# Patient Record
Sex: Female | Born: 1975 | Race: White | Hispanic: No | Marital: Married | Smoking: Current every day smoker
Health system: Southern US, Community
[De-identification: ages and names within clinical notes are randomized; demographics above are authoritative.]

## PROBLEM LIST (undated history)

## (undated) DIAGNOSIS — J45909 Unspecified asthma, uncomplicated: Secondary | ICD-10-CM

## (undated) HISTORY — PX: TUBAL LIGATION: SHX77

---

## 2016-04-14 ENCOUNTER — Encounter: Payer: Self-pay | Admitting: Medical Oncology

## 2016-04-14 ENCOUNTER — Emergency Department
Admission: EM | Admit: 2016-04-14 | Discharge: 2016-04-14 | Disposition: A | Discharge status: Home / Self Care | Payer: Self-pay | Attending: Emergency Medicine | Admitting: Emergency Medicine

## 2016-04-14 DIAGNOSIS — J45909 Unspecified asthma, uncomplicated: Secondary | ICD-10-CM | POA: Insufficient documentation

## 2016-04-14 DIAGNOSIS — K029 Dental caries, unspecified: Secondary | ICD-10-CM | POA: Insufficient documentation

## 2016-04-14 DIAGNOSIS — K0889 Other specified disorders of teeth and supporting structures: Secondary | ICD-10-CM

## 2016-04-14 HISTORY — DX: Unspecified asthma, uncomplicated: J45.909

## 2016-04-14 MED ORDER — AMOXICILLIN 500 MG PO TABS: 500.0000 mg | ORAL_TABLET | Freq: Two times a day (BID) | ORAL | 0 refills | Status: DC

## 2016-04-14 MED ORDER — OXYCODONE-ACETAMINOPHEN 5-325 MG PO TABS: 1.0000 | ORAL_TABLET | ORAL | 0 refills | Status: DC | PRN

## 2016-04-14 NOTE — ED Provider Notes (Signed)
[facility] Emergency Department Provider Note   ____________________________________________   First MD Initiated Contact with Patient 04/14/16 1833     (approximate)  I have reviewed the triage vital signs and the nursing notes.   HISTORY  Chief Complaint Dental Pain    HPI Brenda Waller is a 40 y.o. female presents for evaluation of left upper tooth pain. Patient states symptoms have started school secondary to broken tooth. Recently moved here from out of town desires referral to local tenderness. Scars pain is 10 over 10 nonradiating.   Past Medical History:  Diagnosis Date  . Asthma     There are no active problems to display for this patient.   No past surgical history on file.  Prior to Admission medications   Medication Sig Start Date End Date Taking? Authorizing Provider  amoxicillin (AMOXIL) 500 MG tablet Take 1 tablet (500 mg total) by mouth 2 (two) times daily. 04/14/16   Evangeline Dakinharles M Maricela Schreur, PA-C  oxyCODONE-acetaminophen (ROXICET) 5-325 MG tablet Take 1-2 tablets by mouth every 4 (four) hours as needed for severe pain. 04/14/16   Evangeline Dakinharles M Tonnia Bardin, PA-C    Allergies Ibuprofen and Sulfa antibiotics  No family history on file.  Social History Social History  Substance Use Topics  . Smoking status: Not on file  . Smokeless tobacco: Not on file  . Alcohol use Not on file    Review of Systems Constitutional: No fever/chills ENT: No sore throat.Positive for dental pain. Cardiovascular: Denies chest pain. Respiratory: Denies shortness of breath. Skin: Negative for rash. Neurological: Negative for headaches, focal weakness or numbness.  10-point ROS otherwise negative.  ____________________________________________   PHYSICAL EXAM:  VITAL SIGNS: ED Triage Vitals  Enc Vitals Group     BP 04/14/16 1808 133/70     Pulse Rate 04/14/16 1808 84     Resp 04/14/16 1808 18     Temp 04/14/16 1808 97.7 F (36.5 C)     Temp Source  04/14/16 1808 Oral     SpO2 04/14/16 1808 99 %     Weight 04/14/16 1809 176 lb (79.8 kg)     Height 04/14/16 1809 5\' 3"  (1.6 m)     Head Circumference --      Peak Flow --      Pain Score 04/14/16 1809 8     Pain Loc --      Pain Edu? --      Excl. in GC? --     Constitutional: Alert and oriented. Well appearing and in no acute distress. Nose: No congestion/rhinnorhea. Mouth/Throat: Mucous membranes are moist.  Oropharynx non-erythematous. His dental caries with fractured left upper incisor. Gums slightly erythematous and tender to palpation. Neck: No stridor.  Able, for range of motion nontender. Musculoskeletal: No lower extremity tenderness nor edema.  No joint effusions. Neurologic:  Normal speech and language. No gross focal neurologic deficits are appreciated. No gait instability. Skin:  Skin is warm, dry and intact. No rash noted. Psychiatric: Mood and affect are normal. Speech and behavior are normal.  ____________________________________________   LABS (all labs ordered are listed, but only abnormal results are displayed)  Labs Reviewed - No data to display ____________________________________________    PROCEDURES  Procedure(s) performed: None  Procedures  Critical Care performed: No  ____________________________________________   INITIAL IMPRESSION / ASSESSMENT AND PLAN / ED COURSE  Pertinent labs & imaging results that were available during my care of the patient were reviewed by me and considered in my  medical decision making (see chart for details).  Dental pain with fractured tooth. Rx given for amoxicillin 500 mg 3 times a day Percocet 5/325. A list of local dental providers was given.  Clinical Course     ____________________________________________   FINAL CLINICAL IMPRESSION(S) / ED DIAGNOSES  Final diagnoses:  Pain, dental  Dental caries      NEW MEDICATIONS STARTED DURING THIS VISIT:  New Prescriptions   AMOXICILLIN (AMOXIL) 500  MG TABLET    Take 1 tablet (500 mg total) by mouth 2 (two) times daily.   OXYCODONE-ACETAMINOPHEN (ROXICET) 5-325 MG TABLET    Take 1-2 tablets by mouth every 4 (four) hours as needed for severe pain.     Note:  This document was prepared using Dragon voice recognition software and may include unintentional dictation errors.   Evangeline Dakin, PA-C 04/14/16 1847    Rockne Menghini, MD 04/14/16 2038

## 2016-04-14 NOTE — ED Notes (Signed)
Pt reports top left jaw/tooth pain - pt reports swelling but denies drainage

## 2016-04-14 NOTE — Discharge Instructions (Signed)
OPTIONS FOR DENTAL FOLLOW UP CARE ° °[place] Department of Health and Human Services - Local Safety Net Dental Clinics °http://www.ncdhhs.gov/dph/oralhealth/services/safetynetclinics.htm °  °[facility] (336-562-3123) ° °[facility] ([phone]) ° °[facility] ([phone] ext [phone]) ° °[facility] ([phone]) ° °[facility] ([phone]) °This clinic caters to the indigent population and is on a lottery system. °Location: °[facility], [address], [place] °Clinic Hours: °Wednesdays from 6pm - 9pm, patients seen by a lottery system. °For dates, call or go to [email or web address] °Services: °Cleanings, fillings and simple extractions. °Payment Options: °DENTAL WORK IS FREE OF CHARGE. Bring proof of income or support. °Best way to get seen: °Arrive at 5:15 pm - this is a lottery, NOT first come/first serve, so arriving earlier will not increase your chances of being seen. °  °  °[facility] °[phone] °Select option 1 for emergencies °  °Location: °[facility], [address], [place] °Clinic Hours: °No walk-ins accepted - call the day before to schedule an appointment. °Check in times are 9:30 am and 1:30 pm. °Services: °Simple extractions, temporary fillings, pulpectomy/pulp debridement, uncomplicated abscess drainage. °Payment Options: °PAYMENT IS DUE AT THE TIME OF SERVICE.  Fee is usually $100-200, additional surgical procedures (e.g. abscess drainage) may be extra. °Cash, checks, Visa/MasterCard accepted.  Can file Medicaid if patient is covered for dental - patient should call case worker to check. °No discount for [facility] patients. °Best way to get seen: °MUST call the day before and get onto the schedule. Can usually be seen the next 1-2 days. No walk-ins accepted. °  °  °[facility] °[phone] °   °Location: °[facility], [address], [place] °Clinic Hours: °M, W, Th, F 8am or 1:30pm, Tues 9a or 1:30 - first come/first served. °Services: °Simple extractions, temporary fillings, uncomplicated abscess drainage.  You do not need to be an [place] resident. °Payment Options: °PAYMENT IS DUE AT THE TIME OF SERVICE. °Dental insurance, otherwise sliding scale - bring proof of income or support. °Depending on income and treatment needed, cost is usually $50-200. °Best way to get seen: °Arrive early as it is first come/first served. °  °  °[facility] °[phone] °  °Location: °[address] °Clinic Hours: °Mon-Thu 8a-5p °Services: °Most basic dental services including extractions and fillings. °Payment Options: °PAYMENT IS DUE AT THE TIME OF SERVICE. °Sliding scale, up to 50% off - bring proof if income or support. °Medicaid with dental option accepted. °Best way to get seen: °Call to schedule an appointment, can usually be seen within 2 weeks OR they will try to see walk-ins - show up at 8a or 2p (you may have to wait). °  °  °[facility] °[phone] °[place] RESIDENTS ONLY °  °Location: °[facility], [address], [place], [place] [address] °Clinic Hours: By appointment only. °Monday - Thursday 8am-5pm, Friday 8am-12pm °Services: Cleanings, fillings, extractions. °Payment Options: °PAYMENT IS DUE AT THE TIME OF SERVICE. °Cash, Visa or MasterCard. Sliding scale - $30 minimum per service. °Best way to get seen: °Come in to office, complete packet and make an appointment - need proof of income °or support monies for each household member and proof of [place] residence. °Usually takes about a month to get in. °  °  °[facility] °[phone] °  °Location: °[address]   [place] °Clinic Hours: Walk-in Urgent Care Dental Services are offered Monday-Friday  mornings only. °The numbers of emergencies accepted daily is limited to the number of °providers available. °Maximum 15 - Mondays, Wednesdays & Thursdays °Maximum 10 - Tuesdays & Fridays °Services: °You do not need to be a  [place] resident to be seen for a dental emergency. °Emergencies are defined as pain, swelling, abnormal bleeding, or dental trauma. Walkins will receive x-rays if needed. °NOTE: Dental cleaning is not an emergency. °Payment Options: °PAYMENT IS DUE AT THE TIME OF SERVICE. °Minimum co-pay is $40.00 for uninsured patients. °Minimum co-pay is $3.00 for Medicaid with dental coverage. °Dental Insurance is accepted and must be presented at time of visit. °Medicare does not cover dental. °Forms of payment: Cash, credit card, checks. °Best way to get seen: °If not previously registered with the clinic, walk-in dental registration begins at 7:15 am and is on a first come/first serve basis. °If previously registered with the clinic, call to make an appointment. °  °  °[facility] °[phone] °[place] RESIDENTS ONLY °  °Location: °[address], [place], [place] °Clinic Hours: °Mon-Thu 10a-2p °Services: Extractions only! °Payment Options: °FREE (donations accepted) - bring proof of income or support °Best way to get seen: °Call and schedule an appointment OR come at 8am on the 1st Monday of every month (except for holidays) when it is first come/first served. °  °  °[facility] °[phone] °  °Location: °[address], [place] °Clinic Hours: °Friday mornings °Services, Payment Options, Best way to get seen: °Call for infoOPTIONS FOR DENTAL FOLLOW UP CARE ° °[place] Department of Health and Human Services - Local Safety Net Dental Clinics °http://www.ncdhhs.gov/dph/oralhealth/services/safetynetclinics.htm °  °[facility] ([phone]) ° °[facility] ([phone]) ° °[facility] ([phone] ext 237) ° °[facility]  ([phone]) ° °[facility] ([phone]) °This clinic caters to the indigent population and is on a lottery system. °Location: °[facility], [address], [place] °Clinic Hours: °Wednesdays from 6pm - 9pm, patients seen by a lottery system. °For dates, call or go to [email or web address] °Services: °Cleanings, fillings and simple extractions. °Payment Options: °DENTAL WORK IS FREE OF CHARGE. Bring proof of income or support. °Best way to get seen: °Arrive at 5:15 pm - this is a lottery, NOT first come/first serve, so arriving earlier will not increase your chances of being seen. °  °  °[facility] °[phone] °Select option 1 for emergencies °  °Location: °[facility], [address], [place] °Clinic Hours: °No walk-ins accepted - call the day before to schedule an appointment. °Check in times are 9:30 am and 1:30 pm. °Services: °Simple extractions, temporary fillings, pulpectomy/pulp debridement, uncomplicated abscess drainage. °Payment Options: °PAYMENT IS DUE AT THE TIME OF SERVICE.  Fee is usually $100-200, additional surgical procedures (e.g. abscess drainage) may be extra. °Cash, checks, Visa/MasterCard accepted.  Can file Medicaid if patient is covered for dental - patient should call case worker to check. °No discount for [facility] patients. °Best way to get seen: °MUST call the day before and get onto the schedule. Can usually be seen the next 1-2 days. No walk-ins accepted. °  °  °[facility] °[phone] °  °Location: °[facility], [address], [place] °Clinic Hours: °M, W, Th, F 8am or 1:30pm, Tues 9a or 1:30 - first come/first served. °Services: °Simple extractions, temporary fillings, uncomplicated   abscess drainage.  You do not need to be an [place] resident. °Payment Options: °PAYMENT IS DUE AT THE TIME OF SERVICE. °Dental insurance,  otherwise sliding scale - bring proof of income or support. °Depending on income and treatment needed, cost is usually $50-200. °Best way to get seen: °Arrive early as it is first come/first served. °  °  °[facility] °[phone] °  °Location: °[address] °Clinic Hours: °Mon-Thu 8a-5p °Services: °Most basic dental services including extractions and fillings. °Payment Options: °PAYMENT IS DUE AT THE TIME OF SERVICE. °Sliding scale, up to 50% off - bring proof if income or support. °Medicaid with dental option accepted. °Best way to get seen: °Call to schedule an appointment, can usually be seen within 2 weeks OR they will try to see walk-ins - show up at 8a or 2p (you may have to wait). °  °  °[facility] °[phone] °[place] RESID[place] ONLY °  °Location: °[facility], [address], [place], [place] [address] °Clinic Hours: By appointment only. °Monday - Thursday 8am-5pm, Friday 8am-12pm °Services: Cleanings, fillings, extractions. °Payment Options: °PAYMENT IS DUE AT THE TIME OF SERVICE. °Cash, Visa or MasterCard. Sliding scale - $30 minimum per service. °Best way to get seen: °Come in to office, complete packet and make an appointment - need proof of income °or support monies for each household member and proof of [place] residence. °Usually takes about a month to get in. °  °  °[facility] °[phone] °  °Location: °[address] [place] °Clinic Hours: Walk-in Urgent Care Dental Services are offered Monday-Friday mornings only. °The numbers of emergencies accepted daily is limited to the number of °providers available. °Maximum 15 - Mondays, Wednesdays & Thursdays °Maximum 10 - Tuesdays & Fridays °Services: °You do not need to be a [place] resident to be seen for a dental emergency. °Emergencies are defined as pain, swelling, abnormal bleeding, or dental trauma. Walkins  will receive x-rays if needed. °NOTE: Dental cleaning is not an emergency. °Payment Options: °PAYMENT IS DUE AT THE TIME OF SERVICE. °Minimum co-pay is $40.00 for uninsured patients. °Minimum co-pay is $3.00 for Medicaid with dental coverage. °Dental Insurance is accepted and must be presented at time of visit. °Medicare does not cover dental. °Forms of payment: Cash, credit card, checks. °Best way to get seen: °If not previously registered with the clinic, walk-in dental registration begins at 7:15 am and is on a first come/first serve basis. °If previously registered with the clinic, call to make an appointment. °  °  °[facility] °[phone] °[place] RESIDENTS ONLY °  °Location: °[address], [place],  °Clinic Hours: °Mon-Thu 10a-2p °Services: Extractions only! °Payment Options: °FREE (donations accepted) - bring proof of income or support °Best way to get seen: °Call and schedule an appointment OR come at 8am on the 1st Monday of every month (except for holidays) when it is first come/first served. °  °  °[facility] °[phone] °  °Location: °[address], [place] °Clinic Hours: °Friday mornings °Services, Payment Options, Best way to get seen: °Call for info ° ° °

## 2016-04-14 NOTE — ED Triage Notes (Signed)
Pt reports broken tooth to left front tooth. No injury.

## 2016-07-14 ENCOUNTER — Encounter: Payer: Self-pay | Admitting: Emergency Medicine

## 2016-07-14 ENCOUNTER — Emergency Department: Payer: Medicaid Other

## 2016-07-14 ENCOUNTER — Emergency Department
Admission: EM | Admit: 2016-07-14 | Discharge: 2016-07-14 | Disposition: A | Discharge status: Home / Self Care | Payer: Medicaid Other | Attending: Student in an Organized Health Care Education/Training Program | Admitting: Student in an Organized Health Care Education/Training Program

## 2016-07-14 DIAGNOSIS — F17210 Nicotine dependence, cigarettes, uncomplicated: Secondary | ICD-10-CM | POA: Insufficient documentation

## 2016-07-14 DIAGNOSIS — J069 Acute upper respiratory infection, unspecified: Secondary | ICD-10-CM | POA: Insufficient documentation

## 2016-07-14 DIAGNOSIS — J45909 Unspecified asthma, uncomplicated: Secondary | ICD-10-CM | POA: Insufficient documentation

## 2016-07-14 MED ORDER — BENZONATATE 100 MG PO CAPS: 100.0000 mg | ORAL_CAPSULE | Freq: Three times a day (TID) | ORAL | 0 refills | Status: AC | PRN

## 2016-07-14 MED ORDER — AMOXICILLIN-POT CLAVULANATE 875-125 MG PO TABS: 1.0000 | ORAL_TABLET | Freq: Two times a day (BID) | ORAL | 0 refills | Status: AC

## 2016-07-14 NOTE — ED Triage Notes (Signed)
Patient presents to the ED with cough, congestion, and nasal drainage.  Patient noted slight amount of blood with blowing her nose.  Patient reports history of asthma.  Patient is in no obvious distress at this time.  Ambulatory to triage.  Speaking in full sentences without difficulty.

## 2016-07-14 NOTE — ED Provider Notes (Signed)
[facility] Emergency Department Provider Note  ____________________________________________  Time seen: Approximately 7:08 PM  I have reviewed the triage vital signs and the nursing notes.   HISTORY  Chief Complaint Nasal Congestion    HPI Arsenio KatzLisa Eades is a 40 y.o. female that presents to the emergency department with one week of nasal congestion, postnasal drip, and headache. Patient stated that she started coughing a couple days ago and is coughing up clear/yellow sputum. Patient has been taking Mucinex for symptoms, which is not helping. No fever.   Past Medical History:  Diagnosis Date  . Asthma     There are no active problems to display for this patient.   History reviewed. No pertinent surgical history.  Prior to Admission medications   Medication Sig Start Date End Date Taking? Authorizing Provider  amoxicillin (AMOXIL) 500 MG tablet Take 1 tablet (500 mg total) by mouth 2 (two) times daily. 04/14/16   Charmayne Sheerharles M Beers, PA-C  amoxicillin-clavulanate (AUGMENTIN) 875-125 MG tablet Take 1 tablet by mouth 2 (two) times daily. 07/14/16 07/21/16  Enid DerryAshley Brendan Gadson, PA-C  benzonatate (TESSALON PERLES) 100 MG capsule Take 1 capsule (100 mg total) by mouth 3 (three) times daily as needed for cough. 07/14/16 07/14/17  Enid DerryAshley Genecis Veley, PA-C  oxyCODONE-acetaminophen (ROXICET) 5-325 MG tablet Take 1-2 tablets by mouth every 4 (four) hours as needed for severe pain. 04/14/16   Evangeline Dakinharles M Beers, PA-C    Allergies Ibuprofen and Sulfa antibiotics  No family history on file.  Social History Social History  Substance Use Topics  . Smoking status: Current Every Day Smoker    Packs/day: 0.50    Types: Cigarettes  . Smokeless tobacco: Never Used  . Alcohol use No     Review of Systems  Constitutional: No fever/chills Cardiovascular: No chest pain. Respiratory: No SOB. Gastrointestinal: No abdominal pain.  No nausea, no vomiting.  Musculoskeletal: Negative for  musculoskeletal pain. Skin: Negative for rash, abrasions, lacerations, ecchymosis.   ____________________________________________   PHYSICAL EXAM:  VITAL SIGNS: ED Triage Vitals  Enc Vitals Group     BP 07/14/16 1820 126/72     Pulse Rate 07/14/16 1820 82     Resp 07/14/16 1820 18     Temp 07/14/16 1820 98.4 F (36.9 C)     Temp Source 07/14/16 1820 Oral     SpO2 07/14/16 1820 100 %     Weight 07/14/16 1821 220 lb (99.8 kg)     Height 07/14/16 1821 5\' 3"  (1.6 m)     Head Circumference --      Peak Flow --      Pain Score 07/14/16 1821 7     Pain Loc --      Pain Edu? --      Excl. in GC? --      Constitutional: Alert and oriented. Well appearing and in no acute distress. Eyes: Conjunctivae are normal. PERRL. EOMI. Head: Atraumatic. ENT:      Ears: Tympanic membranes pearly gray with good landmarks.      Nose: Congestion/rhinnorhea. Sinus tenderness.      Mouth/Throat: Mucous membranes are moist. Oropharynx non-erythematous. Tonsils not enlarged. No exudates. Uvula midline. Neck: No stridor. Hematological/Lymphatic/Immunilogical: No cervical lymphadenopathy. Cardiovascular: Normal rate, regular rhythm. Normal S1 and S2.  Good peripheral circulation. Respiratory: Normal respiratory effort without tachypnea or retractions. Lungs CTAB. Good air entry to the bases with no decreased or absent breath sounds. Gastrointestinal: Bowel sounds 4 quadrants. Soft and nontender to palpation. No guarding or  rigidity. No palpable masses. No distention. No CVA tenderness. Musculoskeletal: Full range of motion to all extremities. No gross deformities appreciated. Neurologic:  Normal speech and language. No gross focal neurologic deficits are appreciated.  Skin:  Skin is warm, dry and intact. No rash noted. Psychiatric: Mood and affect are normal. Speech and behavior are normal. Patient exhibits appropriate insight and judgement.   ____________________________________________    LABS (all labs ordered are listed, but only abnormal results are displayed)  Labs Reviewed - No data to display ____________________________________________  EKG   ____________________________________________  RADIOLOGY Lexine BatonI, Shaquille Murdy, personally viewed and evaluated these images (plain radiographs) as part of my medical decision making, as well as reviewing the written report by the radiologist.  Dg Chest 2 View  Result Date: 07/14/2016 CLINICAL DATA:  Cough for 7 days EXAM: CHEST  2 VIEW COMPARISON:  None. FINDINGS: The heart size and mediastinal contours are within normal limits. Both lungs are clear. The visualized skeletal structures are unremarkable. IMPRESSION: No active cardiopulmonary disease. Electronically Signed   By: Elige KoHetal  Patel   On: 07/14/2016 19:10    ____________________________________________    PROCEDURES  Procedure(s) performed:    Procedures    Medications - No data to display   ____________________________________________   INITIAL IMPRESSION / ASSESSMENT AND PLAN / ED COURSE  Pertinent labs & imaging results that were available during my care of the patient were reviewed by me and considered in my medical decision making (see chart for details).  Review of the [place] CSRS was performed in accordance of the [facility] prior to dispensing any controlled drugs.  Clinical Course     Patient's diagnosis is consistent with upper respiratory tract infection. Patient has had nasal congestion and sinus tenderness for over 7 days. Patient will be discharged home with prescriptions for Augmentin. Patient is to follow up with PCP as directed. Patient is given ED precautions to return to the ED for any worsening or new symptoms.     ____________________________________________  FINAL CLINICAL IMPRESSION(S) / ED DIAGNOSES  Final diagnoses:  Upper respiratory tract infection, unspecified type      NEW MEDICATIONS STARTED DURING THIS  VISIT:  Discharge Medication List as of 07/14/2016  7:25 PM    START taking these medications   Details  amoxicillin-clavulanate (AUGMENTIN) 875-125 MG tablet Take 1 tablet by mouth 2 (two) times daily., Starting Tue 07/14/2016, Until Tue 07/21/2016, Print    benzonatate (TESSALON PERLES) 100 MG capsule Take 1 capsule (100 mg total) by mouth 3 (three) times daily as needed for cough., Starting Tue 07/14/2016, Until Wed 07/14/2017, Print            This chart was dictated using voice recognition software/Dragon. Despite best efforts to proofread, errors can occur which can change the meaning. Any change was purely unintentional.    Enid DerryAshley Haddie Bruhl, PA-C 07/14/16 [id number]    Willy EddyPatrick Robinson, MD 07/14/16 2210

## 2017-08-04 ENCOUNTER — Encounter: Payer: Self-pay | Admitting: Emergency Medicine

## 2017-08-04 ENCOUNTER — Emergency Department
Admission: EM | Admit: 2017-08-04 | Discharge: 2017-08-04 | Disposition: A | Discharge status: Home / Self Care | Payer: Self-pay | Attending: Emergency Medicine | Admitting: Emergency Medicine

## 2017-08-04 DIAGNOSIS — W57XXXA Bitten or stung by nonvenomous insect and other nonvenomous arthropods, initial encounter: Secondary | ICD-10-CM | POA: Insufficient documentation

## 2017-08-04 DIAGNOSIS — L0291 Cutaneous abscess, unspecified: Secondary | ICD-10-CM

## 2017-08-04 DIAGNOSIS — Y999 Unspecified external cause status: Secondary | ICD-10-CM | POA: Insufficient documentation

## 2017-08-04 DIAGNOSIS — Y929 Unspecified place or not applicable: Secondary | ICD-10-CM | POA: Insufficient documentation

## 2017-08-04 DIAGNOSIS — L02818 Cutaneous abscess of other sites: Secondary | ICD-10-CM | POA: Insufficient documentation

## 2017-08-04 DIAGNOSIS — F17210 Nicotine dependence, cigarettes, uncomplicated: Secondary | ICD-10-CM | POA: Insufficient documentation

## 2017-08-04 DIAGNOSIS — Y939 Activity, unspecified: Secondary | ICD-10-CM | POA: Insufficient documentation

## 2017-08-04 MED ORDER — LIDOCAINE HCL (PF) 1 % IJ SOLN
5.0000 mL | Freq: Once | INTRAMUSCULAR | Status: DC
  Filled 2017-08-04: qty 5

## 2017-08-04 MED ORDER — OXYCODONE-ACETAMINOPHEN 5-325 MG PO TABS: 1.0000 | ORAL_TABLET | Freq: Four times a day (QID) | ORAL | 0 refills | Status: DC | PRN

## 2017-08-04 MED ORDER — CEPHALEXIN 500 MG PO CAPS: 500.0000 mg | ORAL_CAPSULE | Freq: Three times a day (TID) | ORAL | 0 refills | Status: AC

## 2017-08-04 MED ORDER — OXYCODONE-ACETAMINOPHEN 7.5-325 MG PO TABS
1.0000 | ORAL_TABLET | Freq: Once | ORAL | Status: AC
  Administered 2017-08-04: 1 via ORAL
  Filled 2017-08-04: qty 1

## 2017-08-04 NOTE — ED Notes (Signed)
Bitten by spider on Friday, has been taking Cipro X 5 days. Area to left hip, size of golfball, redness spreading, yellow exudate. Pt appears to be in pain.

## 2017-08-04 NOTE — ED Provider Notes (Signed)
[facility] Emergency Department Provider Note   ____________________________________________   First MD Initiated Contact with Patient 08/04/17 1316     (approximate)  I have reviewed the triage vital signs and the nursing notes.   HISTORY  Chief Complaint Insect Bite   HPI Brenda Waller is a 42 y.o. female is here with an abscess to her left side.  Patient states that she was seen at a clinic after what was thought to be a brown recluse bite.  Patient was been taking Cipro since that time and area has gotten larger.  Patient denies any fever or chills.  She denies any nausea or vomiting.  She states just recently it began draining a small amount.  Currently she rates her pain as an 8 out of 10.   Past Medical History:  Diagnosis Date  . Asthma     There are no active problems to display for this patient.   History reviewed. No pertinent surgical history.  Prior to Admission medications   Medication Sig Start Date End Date Taking? Authorizing Provider  cephALEXin (KEFLEX) 500 MG capsule Take 1 capsule (500 mg total) by mouth 3 (three) times daily. 08/04/17   Tommi Rumps, PA-C  oxyCODONE-acetaminophen (PERCOCET) 5-325 MG tablet Take 1 tablet by mouth every 6 (six) hours as needed for severe pain. 08/04/17   Tommi Rumps, PA-C    Allergies Ibuprofen and Sulfa antibiotics  No family history on file.  Social History Social History   Tobacco Use  . Smoking status: Current Every Day Smoker    Packs/day: 0.50    Types: Cigarettes  . Smokeless tobacco: Never Used  Substance Use Topics  . Alcohol use: No  . Drug use: Not on file    Review of Systems Constitutional: No fever/chills Cardiovascular: Denies chest pain. Respiratory: Denies shortness of breath. Gastrointestinal: No abdominal pain.  No nausea, no vomiting.   Musculoskeletal: Negative for back pain. Skin: Positive for abscess. Neurological: Negative for headaches, focal  weakness or numbness. ____________________________________________   PHYSICAL EXAM:  VITAL SIGNS: ED Triage Vitals  Enc Vitals Group     BP 08/04/17 1302 (!) 172/79     Pulse Rate 08/04/17 1302 77     Resp 08/04/17 1302 20     Temp 08/04/17 1302 98.6 F (37 C)     Temp Source 08/04/17 1302 Oral     SpO2 08/04/17 1302 100 %     Weight 08/04/17 1303 250 lb (113.4 kg)     Height 08/04/17 1303 5\' 2"  (1.575 m)     Head Circumference --      Peak Flow --      Pain Score 08/04/17 1308 8     Pain Loc --      Pain Edu? --      Excl. in GC? --    Constitutional: Alert and oriented. Well appearing and in no acute distress.  Obese. Eyes: Conjunctivae are normal.  Head: Atraumatic. Neck: No stridor.   Cardiovascular: Normal rate, regular rhythm. Grossly normal heart sounds.  Good peripheral circulation. Respiratory: Normal respiratory effort.  No retractions. Lungs CTAB. Musculoskeletal: Moves upper and lower extremities without any difficulty.  Normal gait was noted. Neurologic:  Normal speech and language. No gross focal neurologic deficits are appreciated.  Skin:  Skin is warm, dry and intact.  There is fluctuant abscess with surrounding cellulitis present on the left lateral trunk.  There is some drainage when palpated. Psychiatric: Mood and affect are  normal. Speech and behavior are normal.  ____________________________________________   LABS (all labs ordered are listed, but only abnormal results are displayed)  Labs Reviewed - No data to display   PROCEDURES  Procedure(s) performed: INCISION AND DRAINAGE Performed by: Tommi Rumpshonda L Josean Lycan Consent: Verbal consent obtained. Risks and benefits: risks, benefits and alternatives were discussed Type: abscess  Body area: Left lateral trunk  Anesthesia: local infiltration  Incision was made with a scalpel.  Local anesthetic: lidocaine 1 % without epinephrine  Anesthetic total: 2 ml  Complexity: complex Blunt dissection to  break up loculations  Drainage: purulent  Drainage amount: Moderate  Packing material: 1/4 in iodoform gauze  Patient tolerance: Patient tolerated the procedure well with no immediate complications.    Procedures  Critical Care performed: No  ____________________________________________   INITIAL IMPRESSION / ASSESSMENT AND PLAN / ED COURSE  Patient tolerated procedure fairly well.  She is aware that she will need to come in 2 days to have packing removed.  At this time she will discontinue taking the Cipro and begin taking Keflex 500 mg 3 times daily.  She will continue with Percocet at home for pain.  Patient does not have a PCP at this time.  ____________________________________________   FINAL CLINICAL IMPRESSION(S) / ED DIAGNOSES  Final diagnoses:  Abscess     ED Discharge Orders        Ordered    oxyCODONE-acetaminophen (PERCOCET) 5-325 MG tablet  Every 6 hours PRN     08/04/17 1507    cephALEXin (KEFLEX) 500 MG capsule  3 times daily     08/04/17 1507       Note:  This document was prepared using Dragon voice recognition software and may include unintentional dictation errors.    Tommi RumpsSummers, Zinedine Ellner L, PA-C 08/04/17 [id number]    Jeanmarie PlantMcShane, James A, MD 08/05/17 1504

## 2017-08-04 NOTE — Discharge Instructions (Signed)
Follow[facility] with [facility] or return to the emergency department for removal of your drain in 2 days.  Begin taking Keflex 500 mg 3 times daily for the next 7 days.  Discontinue taking Cipro.  Percocet 1 every 6 hours as needed for pain.  Do not drive while taking this medication.

## 2017-08-04 NOTE — ED Notes (Signed)
Area covered with sterile dressing and covered with large bandaid.

## 2017-08-04 NOTE — ED Triage Notes (Signed)
Pt reports was bit by a [employer] CorporationBrown recluse, was put on cipro but the area is getting worse so she was told to come in to have it drained. Pt reports had taken the meds for 5 days total. Pt reports area is on her left hip.

## 2017-08-04 NOTE — ED Notes (Signed)
Pt alert and oriented X4, active, cooperative, pt in NAD. RR even and unlabored, color WNL.  Pt informed to return if any life threatening symptoms occur.  Discharge and followup instructions reviewed.  

## 2017-08-06 ENCOUNTER — Emergency Department
Admission: EM | Admit: 2017-08-06 | Discharge: 2017-08-06 | Disposition: A | Discharge status: Home / Self Care | Payer: Self-pay | Attending: Emergency Medicine | Admitting: Emergency Medicine

## 2017-08-06 DIAGNOSIS — J45909 Unspecified asthma, uncomplicated: Secondary | ICD-10-CM | POA: Insufficient documentation

## 2017-08-06 DIAGNOSIS — Z5189 Encounter for other specified aftercare: Secondary | ICD-10-CM

## 2017-08-06 DIAGNOSIS — Z4800 Encounter for change or removal of nonsurgical wound dressing: Secondary | ICD-10-CM | POA: Insufficient documentation

## 2017-08-06 DIAGNOSIS — F17210 Nicotine dependence, cigarettes, uncomplicated: Secondary | ICD-10-CM | POA: Insufficient documentation

## 2017-08-06 DIAGNOSIS — Z79899 Other long term (current) drug therapy: Secondary | ICD-10-CM | POA: Insufficient documentation

## 2017-08-06 MED ORDER — CEPHALEXIN 500 MG PO CAPS: 500.0000 mg | ORAL_CAPSULE | Freq: Three times a day (TID) | ORAL | 0 refills | Status: AC

## 2017-08-06 MED ORDER — ONDANSETRON 4 MG PO TBDP: 4.0000 mg | ORAL_TABLET | Freq: Three times a day (TID) | ORAL | 0 refills | Status: AC | PRN

## 2017-08-06 MED ORDER — TRAMADOL HCL 50 MG PO TABS: 50.0000 mg | ORAL_TABLET | Freq: Four times a day (QID) | ORAL | 0 refills | Status: DC | PRN

## 2017-08-06 NOTE — ED Triage Notes (Signed)
Pt states she is here to have her wound on L hip checked and the packing removed.  Pt was bit by a brown recluse on Friday and wound was cut open and packed 2 days ago.  Pt is A&Ox4, in NAD.

## 2017-08-06 NOTE — ED Provider Notes (Signed)
[facility] Emergency Department Provider Note  ____________________________________________  Time seen: Approximately 5:12 PM  I have reviewed the triage vital signs and the nursing notes.   HISTORY  Chief Complaint Wound Check    HPI Brenda Waller is a 42 y.o. female who presents the emergency department for wound recheck.  Patient states that she had been bitten by a brown recluse, developed skin changes and was placed on Cipro by her primary care.  Area worsened, she presented to the emergency department and had incision and drainage.  Patient stopped her Cipro antibiotic and was placed on Keflex.  Patient reports that area is still draining but that the erythema and pain has drastically improved.  Patient is here for packing removal.  She denies any fevers or chills, chest pain, shortness of breath, abdominal pain, nausea vomiting.  Patient has been keeping the area covered with bandages.  She denies any other complaints at this time.  Past Medical History:  Diagnosis Date  . Asthma     There are no active problems to display for this patient.   History reviewed. No pertinent surgical history.  Prior to Admission medications   Medication Sig Start Date End Date Taking? Authorizing Provider  cephALEXin (KEFLEX) 500 MG capsule Take 1 capsule (500 mg total) by mouth 3 (three) times daily. 08/04/17   Tommi Rumps, PA-C  cephALEXin (KEFLEX) 500 MG capsule Take 1 capsule (500 mg total) by mouth 3 (three) times daily for 3 days. 08/06/17 08/09/17  Cuthriell, Delorise Royals, PA-C  ondansetron (ZOFRAN-ODT) 4 MG disintegrating tablet Take 1 tablet (4 mg total) by mouth every 8 (eight) hours as needed for nausea or vomiting. 08/06/17   Cuthriell, Delorise Royals, PA-C  oxyCODONE-acetaminophen (PERCOCET) 5-325 MG tablet Take 1 tablet by mouth every 6 (six) hours as needed for severe pain. 08/04/17   Tommi Rumps, PA-C  traMADol (ULTRAM) 50 MG tablet Take 1 tablet (50 mg  total) by mouth every 6 (six) hours as needed. 08/06/17   Cuthriell, Delorise Royals, PA-C    Allergies Ibuprofen and Sulfa antibiotics  No family history on file.  Social History Social History   Tobacco Use  . Smoking status: Current Every Day Smoker    Packs/day: 0.50    Types: Cigarettes  . Smokeless tobacco: Never Used  Substance Use Topics  . Alcohol use: No  . Drug use: Not on file     Review of Systems  Constitutional: No fever/chills Eyes: No visual changes.  Cardiovascular: no chest pain. Respiratory: no cough. No SOB. Gastrointestinal: No abdominal pain.  No nausea, no vomiting.  No diarrhea.  No constipation. Musculoskeletal: Negative for musculoskeletal pain. Skin: Positive for wound recheck of abscess to the left abdominal wall Neurological: Negative for headaches, focal weakness or numbness. 10-point ROS otherwise negative.  ____________________________________________   PHYSICAL EXAM:  VITAL SIGNS: ED Triage Vitals  Enc Vitals Group     BP 08/06/17 1658 (!) 143/89     Pulse Rate 08/06/17 1658 75     Resp 08/06/17 1658 18     Temp 08/06/17 1658 98.2 F (36.8 C)     Temp Source 08/06/17 1658 Oral     SpO2 08/06/17 1658 100 %     Weight 08/06/17 1658 250 lb (113.4 kg)     Height 08/06/17 1658 5\' 2"  (1.575 m)     Head Circumference --      Peak Flow --      Pain Score 08/06/17 1704  6     Pain Loc --      Pain Edu? --      Excl. in GC? --      Constitutional: Alert and oriented. Well appearing and in no acute distress. Eyes: Conjunctivae are normal. PERRL. EOMI. Head: Atraumatic. Neck: No stridor.    Cardiovascular: Normal rate, regular rhythm. Normal S1 and S2.  Good peripheral circulation. Respiratory: Normal respiratory effort without tachypnea or retractions. Lungs CTAB. Good air entry to the bases with no decreased or absent breath sounds. Musculoskeletal: Full range of motion to all extremities. No gross deformities appreciated. Neurologic:   Normal speech and language. No gross focal neurologic deficits are appreciated.  Skin:  Skin is warm, dry and intact. No rash noted.  Abscess is appreciated to the left lower abdominal wall.  Packing still in place.  Purulent drainage is appreciated around packing.  Area has approximately 1-2 cm of surrounding erythema.  Area is only mildly tender to palpation.  Palpation does not express additional purulent drainage. Psychiatric: Mood and affect are normal. Speech and behavior are normal. Patient exhibits appropriate insight and judgement.   ____________________________________________   LABS (all labs ordered are listed, but only abnormal results are displayed)  Labs Reviewed - No data to display ____________________________________________  EKG   ____________________________________________  RADIOLOGY   No results found.  ____________________________________________    PROCEDURES  Procedure(s) performed:    Wound packing Date/Time: 08/06/2017 5:32 PM Performed by: Racheal Patchesuthriell, Jonathan D, PA-C Authorized by: Racheal Patchesuthriell, Jonathan D, PA-C  Consent: Verbal consent obtained. Consent given by: patient Patient understanding: patient states understanding of the procedure being performed Patient identity confirmed: verbally with patient Local anesthesia used: no  Anesthesia: Local anesthesia used: no  Sedation: Patient sedated: no  Comments: Wound packing was successfully removed.  Visualization of the wound shows significant improvement from previously documented abscess.  Wound is healing appropriately and at this time, repeat packing is unnecessary.  Area is dressed using nonadherent and sterile dressing in the emergency department.       Medications - No data to display   ____________________________________________   INITIAL IMPRESSION / ASSESSMENT AND PLAN / ED COURSE  Pertinent labs & imaging results that were available during my care of the patient were  reviewed by me and considered in my medical decision making (see chart for details).  Review of the [place] CSRS was performed in accordance of the [facility] prior to dispensing any controlled drugs.     Patient's diagnosis is consistent with wound check.  Patient presents with abscess that has been previously treated in this department 2 days ago with incision and drainage.  Per the patient and visualization of abscess compared to previously documented region shows significant improvement.  Packing is removed.  At this time, abscess is improving to the point that repeat packing is not deemed necessary.  Area is dressed prior to discharge.. Patient will be discharged home with prescriptions for continued prescription of antibiotics to length and antibiotics from 7-10 days.  Patient reports that she has significant nausea and GI upset from previously prescribed pain medication.  At this time, patient will be switched to Ultram and given Zofran tablets.  Patient will follow primary care as needed..  Patient is given ED precautions to return to the ED for any worsening or new symptoms.     ____________________________________________  FINAL CLINICAL IMPRESSION(S) / ED DIAGNOSES  Final diagnoses:  Wound check, abscess      NEW MEDICATIONS STARTED DURING THIS  VISIT:  ED Discharge Orders        Ordered    cephALEXin (KEFLEX) 500 MG capsule  3 times daily     08/06/17 1728    traMADol (ULTRAM) 50 MG tablet  Every 6 hours PRN     08/06/17 1728    ondansetron (ZOFRAN-ODT) 4 MG disintegrating tablet  Every 8 hours PRN     08/06/17 1728          This chart was dictated using voice recognition software/Dragon. Despite best efforts to proofread, errors can occur which can change the meaning. Any change was purely unintentional.    Racheal Patches, PA-C 08/06/17 1736    Arnaldo Natal, MD 08/06/17 2146

## 2017-08-06 NOTE — ED Notes (Signed)
See triage note   States she was couple of days ago for possible spider bite  Now here to have area rechecked

## 2017-10-13 ENCOUNTER — Other Ambulatory Visit: Payer: Self-pay

## 2017-10-13 ENCOUNTER — Encounter: Payer: Self-pay | Admitting: *Deleted

## 2017-10-13 ENCOUNTER — Emergency Department
Admission: EM | Admit: 2017-10-13 | Discharge: 2017-10-13 | Disposition: A | Discharge status: Home / Self Care | Payer: Self-pay | Attending: Emergency Medicine | Admitting: Emergency Medicine

## 2017-10-13 DIAGNOSIS — Z79899 Other long term (current) drug therapy: Secondary | ICD-10-CM | POA: Insufficient documentation

## 2017-10-13 DIAGNOSIS — K047 Periapical abscess without sinus: Secondary | ICD-10-CM | POA: Insufficient documentation

## 2017-10-13 DIAGNOSIS — F17210 Nicotine dependence, cigarettes, uncomplicated: Secondary | ICD-10-CM | POA: Insufficient documentation

## 2017-10-13 DIAGNOSIS — J45909 Unspecified asthma, uncomplicated: Secondary | ICD-10-CM | POA: Insufficient documentation

## 2017-10-13 MED ORDER — CLINDAMYCIN HCL 150 MG PO CAPS
300.0000 mg | ORAL_CAPSULE | Freq: Once | ORAL | Status: AC
  Administered 2017-10-13: 300 mg via ORAL
  Filled 2017-10-13: qty 2

## 2017-10-13 MED ORDER — CLINDAMYCIN HCL 300 MG PO CAPS: 300.0000 mg | ORAL_CAPSULE | Freq: Three times a day (TID) | ORAL | 0 refills | Status: AC

## 2017-10-13 MED ORDER — TRAMADOL HCL 50 MG PO TABS: 50.0000 mg | ORAL_TABLET | Freq: Two times a day (BID) | ORAL | 0 refills | Status: AC

## 2017-10-13 MED ORDER — TRAMADOL HCL 50 MG PO TABS
50.0000 mg | ORAL_TABLET | Freq: Once | ORAL | Status: AC
  Administered 2017-10-13: 50 mg via ORAL
  Filled 2017-10-13: qty 1

## 2017-10-13 NOTE — Discharge Instructions (Signed)
You are being treated for a dental abscess. Take the antibiotic as directed and the pain medicine as needed. Follow[facility] with [facility] for [facility]. Return to the ED for increased swelling or fevers.

## 2017-10-13 NOTE — ED Provider Notes (Signed)
[facility] Emergency Department Provider Note ____________________________________________  Time seen: 1645  I have reviewed the triage vital signs and the nursing notes.  HISTORY  Chief Complaint  Facial Swelling  HPI Brenda Waller is a 42 y.o. female presents to the ED for evaluation of a 2-day complaint of some focal swelling to the right lower jaw.  Patient presents with pain, tenderness, and redness to the overlying skin.  She denies any interim fevers, chills, sweats.  She does admit to poor dentition, but denies any focal gum swelling or spontaneous purulent drainage patient had no problems controlling oral secretions, breathing, or swallowing.  She had normal appetite without nausea vomiting any interim.  Past Medical History:  Diagnosis Date  . Asthma     There are no active problems to display for this patient.   History reviewed. No pertinent surgical history.  Prior to Admission medications   Medication Sig Start Date End Date Taking? Authorizing Provider  cephALEXin (KEFLEX) 500 MG capsule Take 1 capsule (500 mg total) by mouth 3 (three) times daily. 08/04/17   Tommi Rumps, PA-C  clindamycin (CLEOCIN) 300 MG capsule Take 1 capsule (300 mg total) by mouth 3 (three) times daily for 10 days. 10/13/17 10/23/17  Montrice Gracey, Charlesetta Ivory, PA-C  ondansetron (ZOFRAN-ODT) 4 MG disintegrating tablet Take 1 tablet (4 mg total) by mouth every 8 (eight) hours as needed for nausea or vomiting. 08/06/17   Cuthriell, Delorise Royals, PA-C  oxyCODONE-acetaminophen (PERCOCET) 5-325 MG tablet Take 1 tablet by mouth every 6 (six) hours as needed for severe pain. 08/04/17   Tommi Rumps, PA-C  traMADol (ULTRAM) 50 MG tablet Take 1 tablet (50 mg total) by mouth 2 (two) times daily. 10/13/17   Shree Espey, Charlesetta Ivory, PA-C    Allergies Ibuprofen and Sulfa antibiotics  No family history on file.  Social History Social History   Tobacco Use  . Smoking status: Current  Every Day Smoker    Packs/day: 0.50    Types: Cigarettes  . Smokeless tobacco: Never Used  Substance Use Topics  . Alcohol use: No  . Drug use: Not on file    Review of Systems  Constitutional: Negative for fever. Eyes: Negative for visual changes. ENT: Negative for sore throat.  Jaw swelling as above. Cardiovascular: Negative for chest pain. Respiratory: Negative for shortness of breath. Gastrointestinal: Negative for abdominal pain, vomiting and diarrhea. Skin: Negative for rash. Neurological: Negative for headaches, focal weakness or numbness. ____________________________________________  PHYSICAL EXAM:  VITAL SIGNS: ED Triage Vitals [10/13/17 1604]  Enc Vitals Group     BP 138/70     Pulse Rate 99     Resp      Temp 97.9 F (36.6 C)     Temp Source Oral     SpO2 100 %     Weight 250 lb (113.4 kg)     Height 5\' 2"  (1.575 m)     Head Circumference      Peak Flow      Pain Score 8     Pain Loc      Pain Edu?      Excl. in GC?     Constitutional: Alert and oriented. Well appearing and in no distress. Head: Normocephalic and atraumatic. Eyes: Conjunctivae are normal. PERRL. Normal extraocular movements Ears: Canals clear. TMs intact bilaterally. Nose: No congestion/rhinorrhea/epistaxis. Mouth/Throat: Mucous membranes are moist. Poor dentition.  Uvula is midline and tonsils are flat.  No brawny sublingual erythema is  noted.  Patient chronically broken and missing teeth to the right lower jaw.  There is no focal gum swelling appreciated.  Patient with fullness to the lower mandible.  The overlying skin is erythematous.  No induration, warmth, or fluctuance is appreciated. Neck: Supple. No thyromegaly. Hematological/Lymphatic/Immunological: No cervical lymphadenopathy. Cardiovascular: Normal rate, regular rhythm. Normal distal pulses. Respiratory: Normal respiratory effort. No wheezes/rales/rhonchi. Skin:  Skin is warm, dry and intact. No rash  noted. ____________________________________________  PROCEDURES  Procedures Clindamycin 300 mg PO ____________________________________________  INITIAL IMPRESSION / ASSESSMENT AND PLAN / ED COURSE  Patient with ED evaluation of sudden focal right lower jaw swelling.  Patient symptoms are consistent with a probable dental abscess.  Patient will be discharged with a prescription for clindamycin and Ultram to take as needed.  She will follow-up with the local community clinics as needed.  A list of local community resources will be provided.  I reviewed the patient's prescription history over the last 12 months in the multi-state controlled substances database(s) that includes [place], [place], [place], [place], [place], [place], [place], [place], [place], [place], [place], [place], and [place].  Results were notable for no recent narcotics.  ____________________________________________  FINAL CLINICAL IMPRESSION(S) / ED DIAGNOSES  Final diagnoses:  Dental abscess      Lissa HoardMenshew, Ollen Rao V Bacon, PA-C 10/13/17 1720    Don PerkingVeronese, [place], MD 10/13/17 1747

## 2017-10-13 NOTE — ED Triage Notes (Signed)
Pt has swelling to right side of face for 3 days.  No resp distress.  No toothache.  Pt has pain in chin area.  No diff swallowing.  Pt alert.

## 2018-06-03 ENCOUNTER — Other Ambulatory Visit: Payer: Self-pay

## 2018-06-03 ENCOUNTER — Emergency Department
Admission: EM | Admit: 2018-06-03 | Discharge: 2018-06-03 | Disposition: A | Discharge status: Home / Self Care | Payer: Self-pay | Attending: Emergency Medicine | Admitting: Emergency Medicine

## 2018-06-03 ENCOUNTER — Encounter: Payer: Self-pay | Admitting: Emergency Medicine

## 2018-06-03 ENCOUNTER — Emergency Department: Payer: Self-pay

## 2018-06-03 DIAGNOSIS — F17210 Nicotine dependence, cigarettes, uncomplicated: Secondary | ICD-10-CM | POA: Insufficient documentation

## 2018-06-03 DIAGNOSIS — M5416 Radiculopathy, lumbar region: Secondary | ICD-10-CM | POA: Insufficient documentation

## 2018-06-03 DIAGNOSIS — Z79899 Other long term (current) drug therapy: Secondary | ICD-10-CM | POA: Insufficient documentation

## 2018-06-03 DIAGNOSIS — J45909 Unspecified asthma, uncomplicated: Secondary | ICD-10-CM | POA: Insufficient documentation

## 2018-06-03 LAB — POCT PREGNANCY, URINE: Preg Test, Ur: NEGATIVE

## 2018-06-03 MED ORDER — OXYCODONE-ACETAMINOPHEN 5-325 MG PO TABS
1.0000 | ORAL_TABLET | Freq: Once | ORAL | Status: AC
  Administered 2018-06-03: 1 via ORAL
  Filled 2018-06-03: qty 1

## 2018-06-03 MED ORDER — PREDNISONE 10 MG PO TABS: ORAL_TABLET | ORAL | 0 refills | Status: DC

## 2018-06-03 MED ORDER — METHYLPREDNISOLONE SODIUM SUCC 125 MG IJ SOLR
125.0000 mg | Freq: Once | INTRAMUSCULAR | Status: AC
Start: 2018-06-03 — End: 2018-06-03
  Administered 2018-06-03: 125 mg via INTRAMUSCULAR
  Filled 2018-06-03: qty 2

## 2018-06-03 MED ORDER — CYCLOBENZAPRINE HCL 5 MG PO TABS: ORAL_TABLET | ORAL | 0 refills | Status: AC

## 2018-06-03 MED ORDER — OXYCODONE-ACETAMINOPHEN 5-325 MG PO TABS: 1.0000 | ORAL_TABLET | ORAL | 0 refills | Status: AC | PRN

## 2018-06-03 NOTE — ED Notes (Signed)
Pt states pain radiates from R hip to back; tingling/numbness at thigh; pulse/color/cap refill 2+ in RLE.

## 2018-06-03 NOTE — ED Triage Notes (Signed)
Pt to ED from home c/o lower right back and hip pain x4 days, states fell 2 weeks ago.  Denies hx of same.

## 2018-06-03 NOTE — ED Provider Notes (Signed)
[facility] Emergency Department Provider Note  ____________________________________________  Time seen: Approximately 5:39 PM  I have reviewed the triage vital signs and the nursing notes.   HISTORY  Chief Complaint Back Pain and Hip Pain    HPI Brenda Waller is a 42 y.o. female presents to emergency department for evaluation of low back pain and right hip pain that radiates into the front of right thigh for 4 days. Right leg does not feel weak but painful to lift.  She fell on her right side about 2 weeks ago.  No history of back pain.  She has used muscle rubs without relief.  She is unable to take NSAIDs.  No bowel or bladder dysfunction or saddle anesthesias.  No fever, chills, nausea, vomiting, abdominal pain, urinary symptoms.   Past Medical History:  Diagnosis Date  . Asthma     There are no active problems to display for this patient.   Past Surgical History:  Procedure Laterality Date  . TUBAL LIGATION      Prior to Admission medications   Medication Sig Start Date End Date Taking? Authorizing Provider  cephALEXin (KEFLEX) 500 MG capsule Take 1 capsule (500 mg total) by mouth 3 (three) times daily. 08/04/17   Tommi Rumps, PA-C  cyclobenzaprine (FLEXERIL) 5 MG tablet Take 1-2 tablets 3 times daily as needed 06/03/18   Enid Derry, PA-C  ondansetron (ZOFRAN-ODT) 4 MG disintegrating tablet Take 1 tablet (4 mg total) by mouth every 8 (eight) hours as needed for nausea or vomiting. 08/06/17   Cuthriell, Delorise Royals, PA-C  oxyCODONE-acetaminophen (PERCOCET) 5-325 MG tablet Take 1 tablet by mouth every 4 (four) hours as needed for severe pain. 06/03/18 06/03/19  Enid Derry, PA-C  predniSONE (DELTASONE) 10 MG tablet Take 6 tablets on day 1, take 5 tablets on day 2, take 4 tablets on day 3, take 3 tablets on day 4, take 2 tablets on day 5, take 1 tablet on day 6 06/03/18   Enid Derry, PA-C  traMADol (ULTRAM) 50 MG tablet Take 1 tablet (50 mg  total) by mouth 2 (two) times daily. 10/13/17   Menshew, Charlesetta Ivory, PA-C    Allergies Ibuprofen and Sulfa antibiotics  History reviewed. No pertinent family history.  Social History Social History   Tobacco Use  . Smoking status: Current Every Day Smoker    Packs/day: 0.50    Types: Cigarettes  . Smokeless tobacco: Never Used  Substance Use Topics  . Alcohol use: No  . Drug use: Never     Review of Systems  Constitutional: No fever/chills Respiratory: No SOB. Gastrointestinal: No abdominal pain.  No nausea, no vomiting.  Musculoskeletal: Positive for back pain. Skin: Negative for rash, abrasions, lacerations, ecchymosis.   ____________________________________________   PHYSICAL EXAM:  VITAL SIGNS: ED Triage Vitals  Enc Vitals Group     BP 06/03/18 1711 (!) 145/66     Pulse Rate 06/03/18 1711 (!) 106     Resp 06/03/18 1711 18     Temp 06/03/18 1711 98.4 F (36.9 C)     Temp Source 06/03/18 1711 Oral     SpO2 06/03/18 1711 100 %     Weight 06/03/18 1712 270 lb (122.5 kg)     Height 06/03/18 1712 5\' 3"  (1.6 m)     Head Circumference --      Peak Flow --      Pain Score 06/03/18 1722 8     Pain Loc --  Pain Edu? --      Excl. in GC? --      Constitutional: Alert and oriented. Well appearing and in no acute distress. Eyes: Conjunctivae are normal. PERRL. EOMI. Head: Atraumatic. ENT:      Ears:      Nose: No congestion/rhinnorhea.      Mouth/Throat: Mucous membranes are moist.  Neck: No stridor.  Cardiovascular: Normal rate, regular rhythm.  Good peripheral circulation. Respiratory: Normal respiratory effort without tachypnea or retractions. Lungs CTAB. Good air entry to the bases with no decreased or absent breath sounds. Gastrointestinal: Bowel sounds 4 quadrants. Soft and nontender to palpation. No guarding or rigidity. No palpable masses. No distention. No CVA tenderness. Musculoskeletal: Full range of motion to all extremities. No gross  deformities appreciated.  No tenderness to palpation over lumbar spine.  Tenderness palpation of her right SI joint.  Strength equal in lower extremities bilaterally.  No foot drop. Neurologic:  Normal speech and language. No gross focal neurologic deficits are appreciated.  Skin:  Skin is warm, dry and intact. No rash noted. Psychiatric: Mood and affect are normal. Speech and behavior are normal. Patient exhibits appropriate insight and judgement.   ____________________________________________   LABS (all labs ordered are listed, but only abnormal results are displayed)  Labs Reviewed  POC URINE PREG, ED  POCT PREGNANCY, URINE   ____________________________________________  EKG   ____________________________________________  RADIOLOGY [name], personally viewed and evaluated these images (plain radiographs) as part of my medical decision making, as well as reviewing the written report by the radiologist.  Dg Lumbar Spine 2-3 Views  Result Date: 06/03/2018 CLINICAL DATA:  42 year old female with right low back pain radiating to the hip for 4 days. Fall 2 weeks ago. EXAM: LUMBAR SPINE - 2-3 VIEW COMPARISON:  None. FINDINGS: Normal lumbar segmentation. Bone mineralization is within normal limits. Preserved lumbar lordosis. Mild posterior L5-S1 disc space loss. Preserved disc spaces otherwise. The sacrum and SI joints appear normal. Incidental bilateral pelvis tubal ligation clips. Negative visible abdominal visceral contours. IMPRESSION: No acute osseous abnormality identified. Possible L5-S1 disc degeneration. Electronically Signed   By: Odessa Fleming M.D.   On: 06/03/2018 18:55   Dg Hip Unilat W Or Wo Pelvis 2-3 Views Right  Result Date: 06/03/2018 CLINICAL DATA:  42 year old female with back pain radiating to the right hip for 4 days. Fall 2 weeks ago. EXAM: DG HIP (WITH OR WITHOUT PELVIS) 2-3V RIGHT COMPARISON:  None. FINDINGS: Bone mineralization is within normal limits.  Femoral heads are normally located. Hip joint spaces appear symmetric and normal. There is no evidence of hip fracture or dislocation. There is no evidence of arthropathy or other focal bone abnormality. Incidental tubal ligation clips. IMPRESSION: Negative. Electronically Signed   By: Odessa Fleming M.D.   On: 06/03/2018 18:56    ____________________________________________    PROCEDURES  Procedure(s) performed:    Procedures    Medications  oxyCODONE-acetaminophen (PERCOCET/ROXICET) 5-325 MG per tablet 1 tablet (1 tablet Oral Given 06/03/18 1757)  methylPREDNISolone sodium succinate (SOLU-MEDROL) 125 mg/2 mL injection 125 mg (125 mg Intramuscular Given 06/03/18 1924)     ____________________________________________   INITIAL IMPRESSION / ASSESSMENT AND PLAN / ED COURSE  Pertinent labs & imaging results that were available during my care of the patient were reviewed by me and considered in my medical decision making (see chart for details).  Review of the  CSRS was performed in accordance of the [facility] prior to dispensing any controlled drugs.  Patient's diagnosis is consistent with lumbar radiculopathy.  Vital signs and exam are reassuring.  X-ray negative for acute bony abnormalities.  Exam is overall reassuring.  Pain resolved with Percocet.  Patient will be discharged home with prescriptions for prednisone, Flexeril, a short course of Percocet. Patient is to follow up with primary care as directed. Patient is given ED precautions to return to the ED for any worsening or new symptoms.     ____________________________________________  FINAL CLINICAL IMPRESSION(S) / ED DIAGNOSES  Final diagnoses:  Lumbar radiculopathy      NEW MEDICATIONS STARTED DURING THIS VISIT:  ED Discharge Orders         Ordered    oxyCODONE-acetaminophen (PERCOCET) 5-325 MG tablet  Every 4 hours PRN     06/03/18 1920    cyclobenzaprine (FLEXERIL) 5 MG tablet     06/03/18 1920     predniSONE (DELTASONE) 10 MG tablet     06/03/18 1920              This chart was dictated using voice recognition software/Dragon. Despite best efforts to proofread, errors can occur which can change the meaning. Any change was purely unintentional.    Enid DerryWagner, Austyn Perriello, PA-C 06/03/18 2245    Jene EveryKinner, Robert, MD 06/03/18 2246

## 2018-06-03 NOTE — ED Notes (Signed)
FIRST NURSE NOTE:  Pt c/o low back pain on the left side near hip and buttocks, pt states the pain is worse when sitting. Pt is ambulatory in lobby.

## 2018-07-04 ENCOUNTER — Emergency Department
Admission: EM | Admit: 2018-07-04 | Discharge: 2018-07-04 | Disposition: A | Discharge status: Home / Self Care | Payer: Medicaid Other | Attending: Emergency Medicine | Admitting: Emergency Medicine

## 2018-07-04 ENCOUNTER — Other Ambulatory Visit: Payer: Self-pay

## 2018-07-04 ENCOUNTER — Encounter: Payer: Self-pay | Admitting: Emergency Medicine

## 2018-07-04 DIAGNOSIS — K047 Periapical abscess without sinus: Secondary | ICD-10-CM

## 2018-07-04 DIAGNOSIS — F17210 Nicotine dependence, cigarettes, uncomplicated: Secondary | ICD-10-CM | POA: Insufficient documentation

## 2018-07-04 MED ORDER — CLINDAMYCIN HCL 150 MG PO CAPS
300.0000 mg | ORAL_CAPSULE | Freq: Once | ORAL | Status: AC
  Administered 2018-07-04: 300 mg via ORAL
  Filled 2018-07-04: qty 2

## 2018-07-04 MED ORDER — TRAMADOL HCL 50 MG PO TABS: 50.0000 mg | ORAL_TABLET | Freq: Four times a day (QID) | ORAL | 0 refills | Status: AC | PRN

## 2018-07-04 MED ORDER — CLINDAMYCIN HCL 300 MG PO CAPS: 300.0000 mg | ORAL_CAPSULE | Freq: Four times a day (QID) | ORAL | 0 refills | Status: AC

## 2018-07-04 NOTE — ED Provider Notes (Signed)
[facility] Emergency Department Provider Note  ____________________________________________  Time seen: Approximately 7:13 PM  I have reviewed the triage vital signs and the nursing notes.   HISTORY  Chief Complaint Dental Pain    HPI Brenda KatzLisa Waller is a 42 y.o. female who presents emergency department complaining of pain and swelling to the right lower jaw.  Patient reports that she has bad teeth, has had dental infections before.  Patient reports that the pain is in her jaw.  No fevers or chills, difficulty breathing or swallowing.  Patient has had no trauma to the area.  No purulent drainage into her mouth.  She has not taken any medication for this complaint prior to arrival.    Past Medical History:  Diagnosis Date  . Asthma     There are no active problems to display for this patient.   Past Surgical History:  Procedure Laterality Date  . TUBAL LIGATION      Prior to Admission medications   Medication Sig Start Date End Date Taking? Authorizing Provider  cephALEXin (KEFLEX) 500 MG capsule Take 1 capsule (500 mg total) by mouth 3 (three) times daily. 08/04/17   Tommi RumpsSummers, Rhonda L, PA-C  clindamycin (CLEOCIN) 300 MG capsule Take 1 capsule (300 mg total) by mouth 4 (four) times daily. 07/04/18   Marla Pouliot, Delorise RoyalsJonathan D, PA-C  cyclobenzaprine (FLEXERIL) 5 MG tablet Take 1-2 tablets 3 times daily as needed 06/03/18   Enid DerryWagner, Ashley, PA-C  ondansetron (ZOFRAN-ODT) 4 MG disintegrating tablet Take 1 tablet (4 mg total) by mouth every 8 (eight) hours as needed for nausea or vomiting. 08/06/17   Jacquel Redditt, Delorise RoyalsJonathan D, PA-C  oxyCODONE-acetaminophen (PERCOCET) 5-325 MG tablet Take 1 tablet by mouth every 4 (four) hours as needed for severe pain. 06/03/18 06/03/19  Enid DerryWagner, Ashley, PA-C  predniSONE (DELTASONE) 10 MG tablet Take 6 tablets on day 1, take 5 tablets on day 2, take 4 tablets on day 3, take 3 tablets on day 4, take 2 tablets on day 5, take 1 tablet on day 6  06/03/18   Enid DerryWagner, Ashley, PA-C  traMADol (ULTRAM) 50 MG tablet Take 1 tablet (50 mg total) by mouth 2 (two) times daily. 10/13/17   Menshew, Charlesetta IvoryJenise V Bacon, PA-C  traMADol (ULTRAM) 50 MG tablet Take 1 tablet (50 mg total) by mouth every 6 (six) hours as needed. 07/04/18   Ileene Allie, Delorise RoyalsJonathan D, PA-C    Allergies Ibuprofen and Sulfa antibiotics  No family history on file.  Social History Social History   Tobacco Use  . Smoking status: Current Every Day Smoker    Packs/day: 0.50    Types: Cigarettes  . Smokeless tobacco: Never Used  Substance Use Topics  . Alcohol use: No  . Drug use: Never     Review of Systems  Constitutional: No fever/chills Eyes: No visual changes. No discharge ENT: Positive for right lower dental/jaw pain, swelling Cardiovascular: no chest pain. Respiratory: no cough. No SOB. Gastrointestinal: No abdominal pain.  No nausea, no vomiting.  No diarrhea.  No constipation. Musculoskeletal: Negative for musculoskeletal pain. Skin: Negative for rash, abrasions, lacerations, ecchymosis. Neurological: Negative for headaches, focal weakness or numbness. 10-point ROS otherwise negative.  ____________________________________________   PHYSICAL EXAM:  VITAL SIGNS: ED Triage Vitals  Enc Vitals Group     BP 07/04/18 1846 (!) 143/80     Pulse Rate 07/04/18 1846 88     Resp 07/04/18 1846 16     Temp 07/04/18 1846 98.3 F (36.8 C)  Temp Source 07/04/18 1846 Oral     SpO2 --      Weight 07/04/18 1815 270 lb 1 oz (122.5 kg)     Height --      Head Circumference --      Peak Flow --      Pain Score 07/04/18 1814 8     Pain Loc --      Pain Edu? --      Excl. in GC? --      Constitutional: Alert and oriented. Well appearing and in no acute distress. Eyes: Conjunctivae are normal. PERRL. EOMI. Head: Atraumatic. ENT:      Ears:       Nose: No congestion/rhinnorhea.      Mouth/Throat: Mucous membranes are moist.  Visualization of dentition reveals  poor dentition throughout.  Multiple caries, erosions.  Patient does have erosions into the gumline in the right lower dentition.  No gross edema or erythema in the oropharynx.  Patient does have palpable firm lesion along the right lower jaw.  No fluctuance or induration. Neck: No stridor.  Neck is supple full range of motion Hematological/Lymphatic/Immunilogical: No cervical lymphadenopathy. Cardiovascular: Normal rate, regular rhythm. Normal S1 and S2.  Good peripheral circulation. Respiratory: Normal respiratory effort without tachypnea or retractions. Lungs CTAB. Good air entry to the bases with no decreased or absent breath sounds. Musculoskeletal: Full range of motion to all extremities. No gross deformities appreciated. Neurologic:  Normal speech and language. No gross focal neurologic deficits are appreciated.  Skin:  Skin is warm, dry and intact. No rash noted. Psychiatric: Mood and affect are normal. Speech and behavior are normal. Patient exhibits appropriate insight and judgement.   ____________________________________________   LABS (all labs ordered are listed, but only abnormal results are displayed)  Labs Reviewed - No data to display ____________________________________________  EKG   ____________________________________________  RADIOLOGY   No results found.  ____________________________________________    PROCEDURES  Procedure(s) performed:    Procedures    Medications  clindamycin (CLEOCIN) capsule 300 mg (has no administration in time range)     ____________________________________________   INITIAL IMPRESSION / ASSESSMENT AND PLAN / ED COURSE  Pertinent labs & imaging results that were available during my care of the patient were reviewed by me and considered in my medical decision making (see chart for details).  Review of the [place] CSRS was performed in accordance of the [facility] prior to dispensing any controlled drugs.      Patient's  diagnosis is consistent with dental infection.  Patient presents emergency department with right lower dental pain/swelling.  Exam is consistent with dental infection.  No indication for labs or imaging.  No indication for incision and drainage.  Patient is given first dose of oral antibiotic care in the emergency department.. Patient will be discharged home with prescriptions for clindamycin, limited prescription of Ultram. Patient is to follow up with dentist as needed or otherwise directed. Patient is given ED precautions to return to the ED for any worsening or new symptoms.     ____________________________________________  FINAL CLINICAL IMPRESSION(S) / ED DIAGNOSES  Final diagnoses:  Dental infection      NEW MEDICATIONS STARTED DURING THIS VISIT:  ED Discharge Orders         Ordered    clindamycin (CLEOCIN) 300 MG capsule  4 times daily     07/04/18 1929    traMADol (ULTRAM) 50 MG tablet  Every 6 hours PRN     07/04/18 1929  This chart was dictated using voice recognition software/Dragon. Despite best efforts to proofread, errors can occur which can change the meaning. Any change was purely unintentional.    Racheal Patches, PA-C 07/04/18 1930    Arnaldo Natal, MD 07/05/18 [phone]

## 2018-07-04 NOTE — ED Notes (Signed)
Patient presents to the ED with right jaw pain and swelling.  Patient states she has 2 broken teeth on that side.  Patient states pain and swelling began approx. 3 days ago.  Patient appears uncomfortable.  Denies fever.

## 2018-07-04 NOTE — Discharge Instructions (Signed)
OPTIONS FOR DENTAL FOLLOW UP CARE ° °[place] [facility] and [facility] - Local Safety Net Dental Clinics °http://www.ncdhhs.gov/dph/oralhealth/services/safetynetclinics.htm °  °[facility] (336-562-3123) ° °[facility] [place] (919-933-9087) ° °[facility] ([phone] ext [phone]) ° °[facility] ([phone]) ° °[facility] ([phone]) °This clinic caters to the indigent population and is on a lottery system. °Location: °[facility], [address], [place] °Clinic Hours: °Wednesdays from 6pm - 9pm, patients seen by a lottery system. °For dates, call or go to www.med.[facility].edu/[facility]/patients/Dental-[facility] °Services: °Cleanings, fillings and simple extractions. °Payment Options: °DENTAL WORK IS FREE OF CHARGE. Bring proof of income or support. °Best way to get seen: °Arrive at 5:15 pm - this is a lottery, NOT first come/first serve, so arriving earlier will not increase your chances of being seen. °  °  °[facility] °[phone] °Select option 1 for emergencies °  °Location: °[facility], [address], [place] °Clinic Hours: °No walk-ins accepted - call the day before to schedule an appointment. °Check in times are 9:30 am and 1:30 pm. °Services: °Simple extractions, temporary fillings, pulpectomy/pulp debridement, uncomplicated abscess drainage. °Payment Options: °PAYMENT IS DUE AT THE TIME OF SERVICE.  Fee is usually $100-200, additional surgical procedures (e.g. abscess drainage) may be extra. °Cash, checks, Visa/MasterCard accepted.  Can file Medicaid if patient is covered for dental - patient should call case worker to check. °No discount for [facility] patients. °Best way to get seen: °MUST call the day before and get onto the schedule. Can usually be seen the next 1-2 days. No walk-ins accepted. °  °  °[facility] °[phone] °   °Location: °[facility], [address], [place] °Clinic Hours: °M, W, Th, F 8am or 1:30pm, Tues 9a or 1:30 - first come/first served. °Services: °Simple extractions, temporary fillings, uncomplicated abscess drainage.  You do not need to be an [place] resident. °Payment Options: °PAYMENT IS DUE AT THE TIME OF SERVICE. °Dental insurance, otherwise sliding scale - bring proof of income or support. °Depending on income and treatment needed, cost is usually $50-200. °Best way to get seen: °Arrive early as it is first come/first served. °  °  °[facility] °[phone] °  °Location: °[address] °Clinic Hours: °Mon-Thu 8a-5p °Services: °Most basic dental services including extractions and fillings. °Payment Options: °PAYMENT IS DUE AT THE TIME OF SERVICE. °Sliding scale, up to 50% off - bring proof if income or support. °Medicaid with dental option accepted. °Best way to get seen: °Call to schedule an appointment, can usually be seen within 2 weeks OR they will try to see walk-ins - show up at 8a or 2p (you may have to wait). °  °  °[facility] °919-245-2435 °[place] RESIDENTS ONLY °  °Location: °[facility], [address], [place], [place] [address] °Clinic Hours: By appointment only. °Monday - Thursday 8am-5pm, Friday 8am-12pm °Services: Cleanings, fillings, extractions. °Payment Options: °PAYMENT IS DUE AT THE TIME OF SERVICE. °Cash, Visa or MasterCard. Sliding scale - $30 minimum per service. °Best way to get seen: °Come in to office, complete packet and make an appointment - need proof of income °or support monies for each household member and proof of [place] residence. °Usually takes about a month to get in. °  °  °[facility] °[phone] °  °Location: °[address]   [place] °Clinic Hours: Walk-in Urgent Care Dental Services are offered Monday-Friday  mornings only. °The numbers of emergencies accepted daily is limited to the number of °providers available. °Maximum 15 - Mondays, Wednesdays & Thursdays °Maximum 10 - Tuesdays & Fridays °Services: °You do not need to be a [place] resident to be seen for a dental emergency. °Emergencies are defined as pain, swelling, abnormal bleeding, or dental trauma. Walkins will receive x-rays if needed. °NOTE: Dental cleaning is not an emergency. °Payment Options: °PAYMENT IS DUE AT THE TIME OF SERVICE. °Minimum co-pay is $40.00 for uninsured patients. °Minimum co-pay is $3.00 for Medicaid with dental coverage. °Dental Insurance is accepted and must be presented at time of visit. °Medicare does not cover dental. °Forms of payment: Cash, credit card, checks. °Best way to get seen: °If not previously registered with the clinic, walk-in dental registration begins at 7:15 am and is on a first come/first serve basis. °If previously registered with the clinic, call to make an appointment. °  °  °[facility] °[phone] °[place] RESIDENTS ONLY °  °Location: °[address], [place], [place] °Clinic Hours: °Mon-Thu 10a-2p °Services: Extractions only! °Payment Options: °FREE (donations accepted) - bring proof of income or support °Best way to get seen: °Call and schedule an appointment OR come at 8am on the 1st Monday of every month (except for holidays) when it is first come/first served. °  °  °[facility] °[phone] °  °Location: °[address], [place] °Clinic Hours: °Friday mornings °Services, Payment Options, Best way to get seen: °Call for info °

## 2018-07-04 NOTE — ED Triage Notes (Signed)
C/O right lower dental pain x 3 days.

## 2018-07-27 ENCOUNTER — Emergency Department: Payer: Medicaid Other

## 2018-07-27 ENCOUNTER — Encounter: Payer: Self-pay | Admitting: Emergency Medicine

## 2018-07-27 ENCOUNTER — Emergency Department
Admission: EM | Admit: 2018-07-27 | Discharge: 2018-07-27 | Disposition: A | Discharge status: Home / Self Care | Payer: Medicaid Other | Attending: Emergency Medicine | Admitting: Emergency Medicine

## 2018-07-27 DIAGNOSIS — M92522 Juvenile osteochondrosis of tibia tubercle, left leg: Secondary | ICD-10-CM

## 2018-07-27 DIAGNOSIS — Z79899 Other long term (current) drug therapy: Secondary | ICD-10-CM | POA: Insufficient documentation

## 2018-07-27 DIAGNOSIS — F17210 Nicotine dependence, cigarettes, uncomplicated: Secondary | ICD-10-CM | POA: Insufficient documentation

## 2018-07-27 DIAGNOSIS — J45909 Unspecified asthma, uncomplicated: Secondary | ICD-10-CM | POA: Insufficient documentation

## 2018-07-27 DIAGNOSIS — M9252 Juvenile osteochondrosis of tibia and fibula, left leg: Secondary | ICD-10-CM | POA: Insufficient documentation

## 2018-07-27 MED ORDER — DEXAMETHASONE SODIUM PHOSPHATE 10 MG/ML IJ SOLN
10.0000 mg | Freq: Once | INTRAMUSCULAR | Status: AC
  Administered 2018-07-27: 10 mg via INTRAMUSCULAR
  Filled 2018-07-27: qty 1

## 2018-07-27 MED ORDER — PREDNISONE 10 MG (21) PO TBPK: ORAL_TABLET | ORAL | 0 refills | Status: AC

## 2018-07-27 NOTE — ED Triage Notes (Signed)
Pt reports about 3 weeks ago she fell and bruised her left knee. Pt states that her knee is still painful from the fall but the bruising is gone.

## 2018-07-27 NOTE — ED Provider Notes (Signed)
[facility] Emergency Department Provider Note  ____________________________________________  Time seen: Approximately 5:31 PM  I have reviewed the triage vital signs and the nursing notes.   HISTORY  Chief Complaint Knee Pain and Knee Injury    HPI Brenda Waller is a 43 y.o. female presents to the emergency department with left anterior knee pain in the distribution of the tibial tuberosity.  Patient reports that she has had pain over the past several years but reports that pain has worsened in intensity over the past 2 to 3 days to the point that she cannot ambulate comfortably.  She denies numbness or tingling down the left leg.  No new falls or instances of trauma.  No redness overlying the knee or history of gout.  Patient denies a history of athleticism or frequent changes in weight throughout her life.  Patient has anaphylaxis with anti-inflammatories and has been taking Tylenol for pain.  No other alleviating measures have been attempted.   Past Medical History:  Diagnosis Date  . Asthma     There are no active problems to display for this patient.   Past Surgical History:  Procedure Laterality Date  . TUBAL LIGATION      Prior to Admission medications   Medication Sig Start Date End Date Taking? Authorizing Provider  cephALEXin (KEFLEX) 500 MG capsule Take 1 capsule (500 mg total) by mouth 3 (three) times daily. 08/04/17   Tommi Rumps, PA-C  clindamycin (CLEOCIN) 300 MG capsule Take 1 capsule (300 mg total) by mouth 4 (four) times daily. 07/04/18   Cuthriell, Delorise Royals, PA-C  cyclobenzaprine (FLEXERIL) 5 MG tablet Take 1-2 tablets 3 times daily as needed 06/03/18   Enid Derry, PA-C  ondansetron (ZOFRAN-ODT) 4 MG disintegrating tablet Take 1 tablet (4 mg total) by mouth every 8 (eight) hours as needed for nausea or vomiting. 08/06/17   Cuthriell, Delorise Royals, PA-C  oxyCODONE-acetaminophen (PERCOCET) 5-325 MG tablet Take 1 tablet by mouth every  4 (four) hours as needed for severe pain. 06/03/18 06/03/19  Enid Derry, PA-C  predniSONE (STERAPRED UNI-PAK 21 TAB) 10 MG (21) TBPK tablet Take 6 tablets the first day, take 5 tablets the second day, take 4 tablets the third day, take 3 tablets the fourth day, take 2 tablets the fifth day, take 1 tablet the sixth day. 07/27/18   Orvil Feil, PA-C  traMADol (ULTRAM) 50 MG tablet Take 1 tablet (50 mg total) by mouth 2 (two) times daily. 10/13/17   Menshew, Charlesetta Ivory, PA-C  traMADol (ULTRAM) 50 MG tablet Take 1 tablet (50 mg total) by mouth every 6 (six) hours as needed. 07/04/18   Cuthriell, Delorise Royals, PA-C    Allergies Ibuprofen and Sulfa antibiotics  No family history on file.  Social History Social History   Tobacco Use  . Smoking status: Current Every Day Smoker    Packs/day: 0.50    Types: Cigarettes  . Smokeless tobacco: Never Used  Substance Use Topics  . Alcohol use: No  . Drug use: Never     Review of Systems  Constitutional: No fever/chills Eyes: No visual changes. No discharge ENT: No upper respiratory complaints. Cardiovascular: no chest pain. Respiratory: no cough. No SOB. Gastrointestinal: No abdominal pain.  No nausea, no vomiting.  No diarrhea.  No constipation. Genitourinary: Negative for dysuria. No hematuria Musculoskeletal: Patient has left anterior knee pain.  Skin: Negative for rash, abrasions, lacerations, ecchymosis. Neurological: Negative for headaches, focal weakness or numbness.   ____________________________________________  PHYSICAL EXAM:  VITAL SIGNS: ED Triage Vitals  Enc Vitals Group     BP 07/27/18 1435 134/79     Pulse Rate 07/27/18 1435 82     Resp 07/27/18 1435 20     Temp 07/27/18 1435 98.7 F (37.1 C)     Temp Source 07/27/18 1435 Oral     SpO2 07/27/18 1435 100 %     Weight 07/27/18 1433 270 lb (122.5 kg)     Height 07/27/18 1433 5\' 2"  (1.575 m)     Head Circumference --      Peak Flow --      Pain Score  07/27/18 1433 7     Pain Loc --      Pain Edu? --      Excl. in GC? --      Constitutional: Alert and oriented. Well appearing and in no acute distress. Eyes: Conjunctivae are normal. PERRL. EOMI. Head: Atraumatic. Cardiovascular: Normal rate, regular rhythm. Normal S1 and S2.  Good peripheral circulation. Respiratory: Normal respiratory effort without tachypnea or retractions. Lungs CTAB. Good air entry to the bases with no decreased or absent breath sounds. Musculoskeletal: Patient has significant tenderness to palpation over the tibial tuberosity with no other deficits with provocative testing.  Palpable dorsalis pedis pulse, left. Neurologic:  Normal speech and language. No gross focal neurologic deficits are appreciated.  Skin:  Skin is warm, dry and intact. No rash noted. Psychiatric: Mood and affect are normal. Speech and behavior are normal. Patient exhibits appropriate insight and judgement.   ____________________________________________   LABS (all labs ordered are listed, but only abnormal results are displayed)  Labs Reviewed - No data to display ____________________________________________  EKG   ____________________________________________  RADIOLOGY I personally viewed and evaluated these images as part of my medical decision making, as well as reviewing the written report by the radiologist.  Dg Knee Complete 4 Views Left  Result Date: 07/27/2018 CLINICAL DATA:  43 y/o F; fall 3 weeks ago with bruising of the left knee. EXAM: LEFT KNEE - COMPLETE 4+ VIEW COMPARISON:  None. FINDINGS: No evidence of fracture, dislocation, or joint effusion. Ossification within lower patellar tendon and productive changes of tibial tubercle compatible sequelae of prior Osgood-Schlatter disease. IMPRESSION: No acute fracture or dislocation identified. Chronic sequelae of Osgood-Schlatter disease. Electronically Signed   By: Mitzi HansenLance  [place]-Stratton M.D.   On: 07/27/2018 16:52     ____________________________________________    PROCEDURES  Procedure(s) performed:    Procedures    Medications  dexamethasone (DECADRON) injection 10 mg (has no administration in time range)     ____________________________________________   INITIAL IMPRESSION / ASSESSMENT AND PLAN / ED COURSE  Pertinent labs & imaging results that were available during my care of the patient were reviewed by me and considered in my medical decision making (see chart for details).  Review of the [place] CSRS was performed in accordance of the [facility] prior to dispensing any controlled drugs.    Assessment and plan Osgood Slaughter Patient presents to the emergency department with tenderness to palpation over the tibial tuberosity and x-ray findings of the left knee consistent with Sharlett Ilessgood Slaughter.  Patient cannot tolerate anti-inflammatories at home due to prior instances of anaphylaxis.  Patient was started on prednisone and advised to use ice nightly.  A referral to orthopedics was given.  All patient questions were answered.    ____________________________________________  FINAL CLINICAL IMPRESSION(S) / ED DIAGNOSES  Final diagnoses:  Osgood-Schlatter's disease, left  NEW MEDICATIONS STARTED DURING THIS VISIT:  ED Discharge Orders         Ordered    predniSONE (STERAPRED UNI-PAK 21 TAB) 10 MG (21) TBPK tablet     07/27/18 1727              This chart was dictated using voice recognition software/Dragon. Despite best efforts to proofread, errors can occur which can change the meaning. Any change was purely unintentional.    Orvil FeilWoods, Rayden Dock M, PA-C 07/27/18 1741    Dionne BucySiadecki, Sebastian, MD 07/27/18 [phone]

## 2018-07-27 NOTE — ED Notes (Signed)
Pt is being discharged to home. Pt is AOx4, VSS, she does not show any signs of distress and c/o moderate pain in her left knee. AVS and prescription was given and explained to the pt and she verbalized understanding of all information as well as the discharge plan of care.

## 2020-04-25 IMAGING — CR DG HIP (WITH OR WITHOUT PELVIS) 2-3V*R*
3 series · 3 of 3 positions shown · non-contrast
Comparison: None.

CLINICAL DATA: 42-year-old female with back pain radiating to the
right hip for 4 days. Fall 2 weeks ago.

EXAM:
DG HIP (WITH OR WITHOUT PELVIS) 2-3V RIGHT

[pelvis ap]
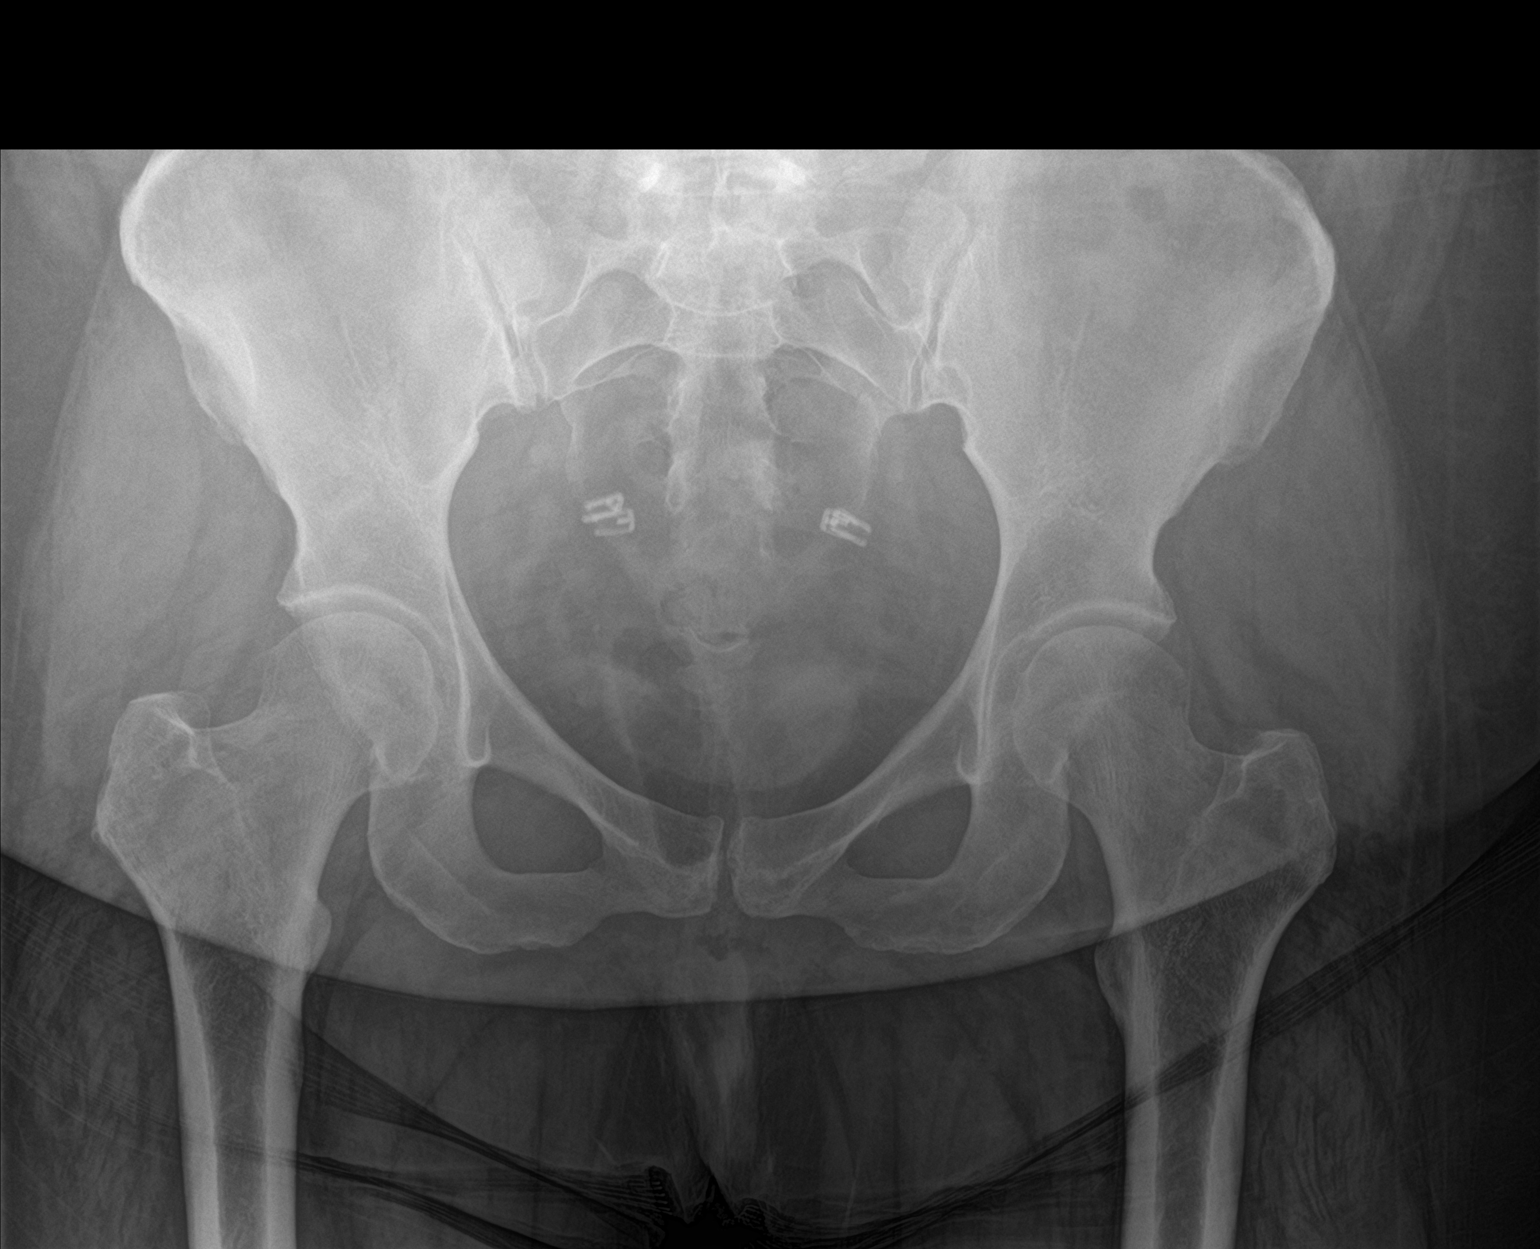

[hip ap]
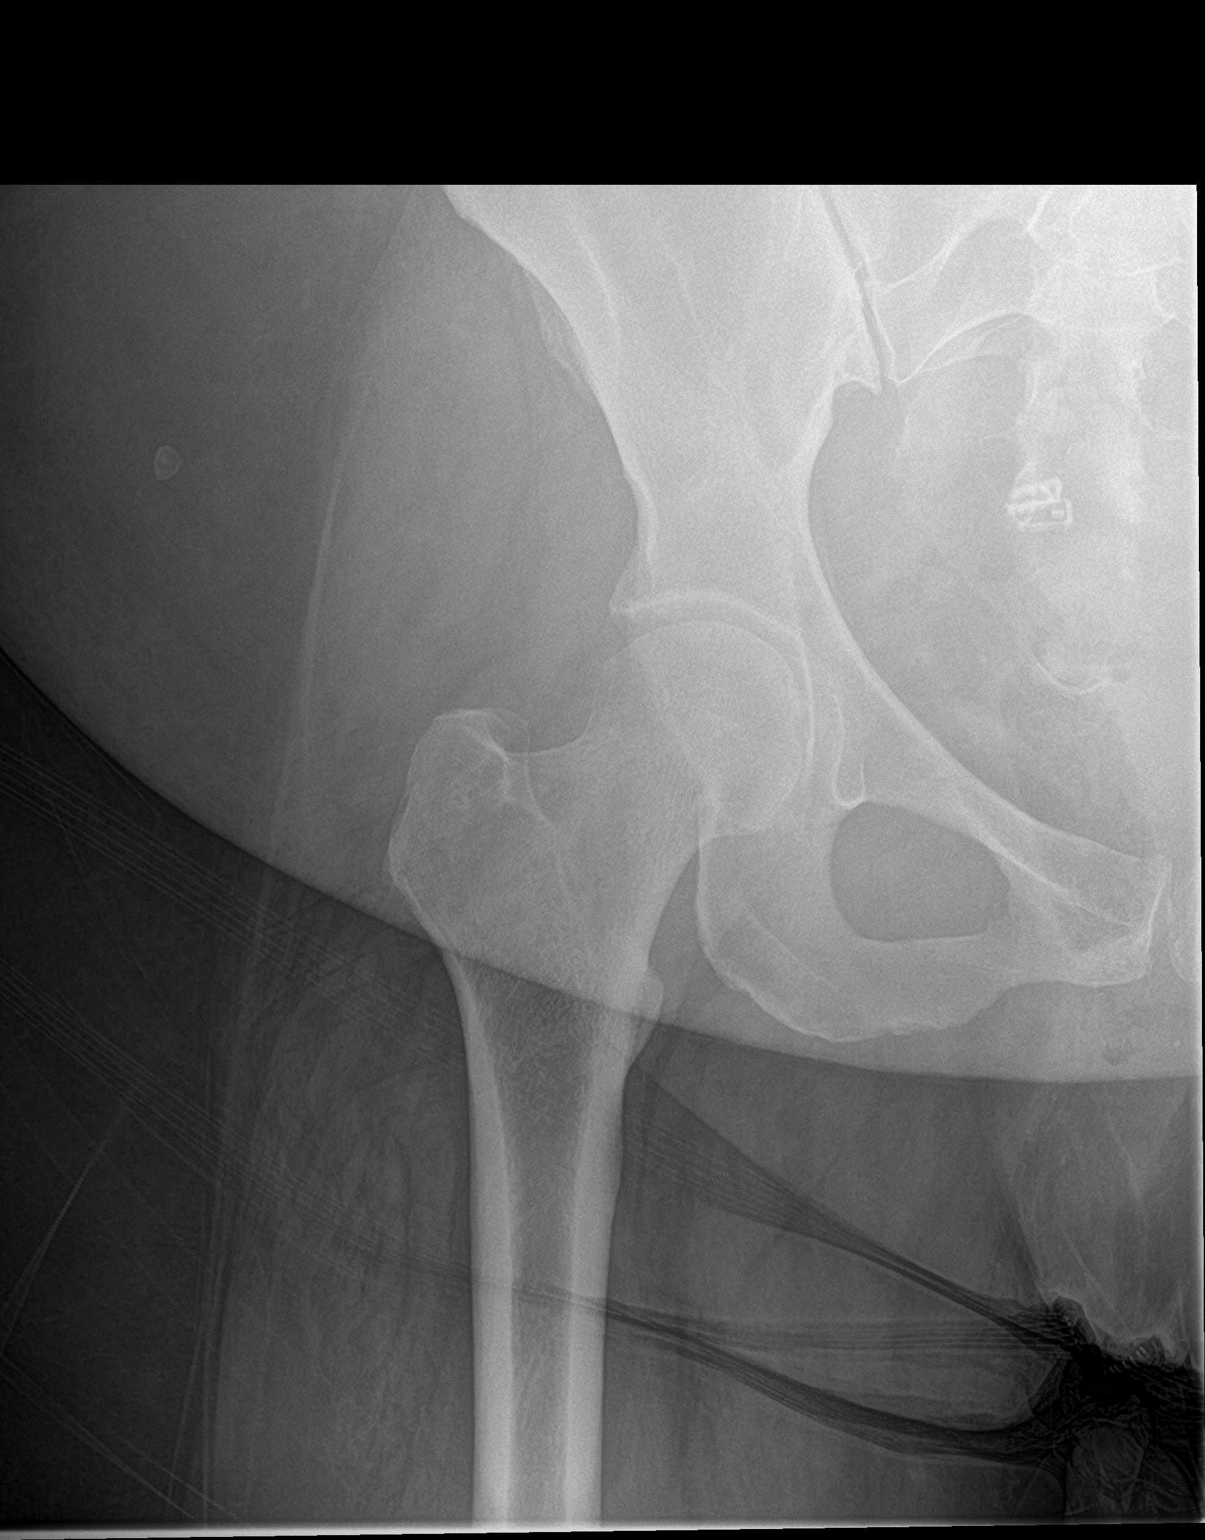

[hip lat]
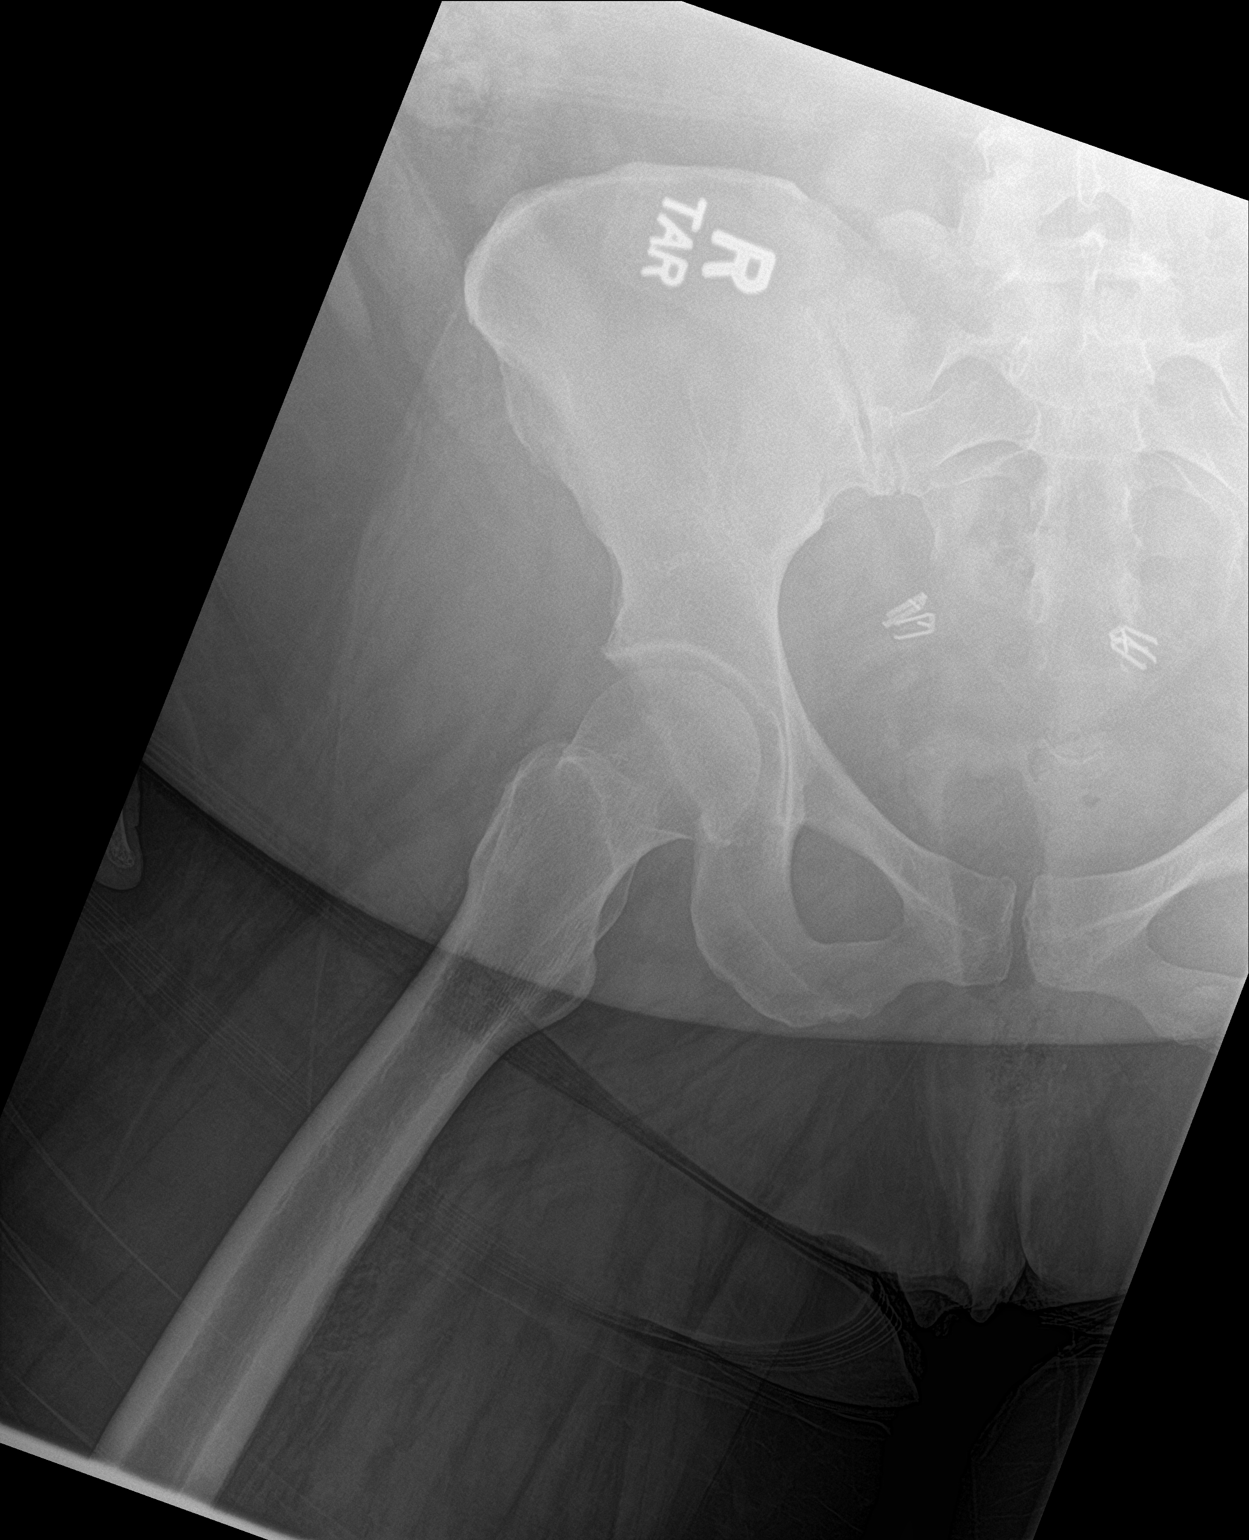

[3 of 3 positions shown; findings below may reference images not displayed]

FINDINGS: Bone mineralization is within normal limits. Femoral heads are
normally located. Hip joint spaces appear symmetric and normal.
There is no evidence of hip fracture or dislocation. There is no
evidence of arthropathy or other focal bone abnormality. Incidental
tubal ligation clips.
IMPRESSION: Negative.
